# Patient Record
Sex: Male | Born: 1975
Health system: Southern US, Community
[De-identification: ages and names within clinical notes are randomized; demographics above are authoritative.]

## PROBLEM LIST (undated history)

## (undated) DIAGNOSIS — M810 Age-related osteoporosis without current pathological fracture: Secondary | ICD-10-CM

## (undated) DIAGNOSIS — R748 Abnormal levels of other serum enzymes: Secondary | ICD-10-CM

## (undated) DIAGNOSIS — E119 Type 2 diabetes mellitus without complications: Secondary | ICD-10-CM

## (undated) DIAGNOSIS — R160 Hepatomegaly, not elsewhere classified: Secondary | ICD-10-CM

## (undated) DIAGNOSIS — E785 Hyperlipidemia, unspecified: Secondary | ICD-10-CM

## (undated) DIAGNOSIS — R011 Cardiac murmur, unspecified: Secondary | ICD-10-CM

## (undated) DIAGNOSIS — K219 Gastro-esophageal reflux disease without esophagitis: Secondary | ICD-10-CM

## (undated) DIAGNOSIS — K760 Fatty (change of) liver, not elsewhere classified: Secondary | ICD-10-CM

## (undated) HISTORY — DX: Type 2 diabetes mellitus without complications: E11.9

## (undated) HISTORY — PX: IR FIBRIN GLUE REPAIR ANAL FISTULA: IMG2325

## (undated) HISTORY — PX: EAR CANALOPLASTY: SHX1481

## (undated) HISTORY — DX: Hepatomegaly, not elsewhere classified: R16.0

## (undated) HISTORY — PX: NASAL SEPTUM SURGERY: SHX37

## (undated) HISTORY — PX: COLONOSCOPY: SHX174

## (undated) HISTORY — DX: Gastro-esophageal reflux disease without esophagitis: K21.9

## (undated) HISTORY — DX: Abnormal levels of other serum enzymes: R74.8

## (undated) HISTORY — DX: Hyperlipidemia, unspecified: E78.5

## (undated) HISTORY — DX: Fatty (change of) liver, not elsewhere classified: K76.0

---

## 2006-01-18 ENCOUNTER — Ambulatory Visit: Payer: Self-pay | Admitting: Internal Medicine

## 2010-10-22 ENCOUNTER — Ambulatory Visit: Payer: Self-pay | Admitting: Internal Medicine

## 2013-07-26 ENCOUNTER — Ambulatory Visit: Payer: Self-pay | Admitting: Family Medicine

## 2014-12-01 ENCOUNTER — Emergency Department: Payer: Self-pay | Admitting: Emergency Medicine

## 2014-12-04 ENCOUNTER — Ambulatory Visit: Payer: Self-pay | Admitting: Family Medicine

## 2014-12-05 DIAGNOSIS — E119 Type 2 diabetes mellitus without complications: Secondary | ICD-10-CM

## 2014-12-05 HISTORY — DX: Type 2 diabetes mellitus without complications: E11.9

## 2015-04-07 ENCOUNTER — Ambulatory Visit
Admission: RE | Admit: 2015-04-07 | Discharge: 2015-04-07 | Disposition: A | Discharge status: Home / Self Care | Payer: BLUE CROSS/BLUE SHIELD | Attending: Family Medicine | Admitting: Family Medicine

## 2015-04-07 ENCOUNTER — Other Ambulatory Visit: Payer: Self-pay | Admitting: Family Medicine

## 2015-04-07 ENCOUNTER — Ambulatory Visit
Admission: RE | Admit: 2015-04-07 | Discharge: 2015-04-07 | Disposition: A | Discharge status: Home / Self Care | Payer: BLUE CROSS/BLUE SHIELD | Source: Ambulatory Visit | Attending: Family Medicine | Admitting: Family Medicine

## 2015-04-07 DIAGNOSIS — R0789 Other chest pain: Secondary | ICD-10-CM | POA: Insufficient documentation

## 2015-04-17 ENCOUNTER — Other Ambulatory Visit: Payer: Self-pay | Admitting: Family Medicine

## 2015-04-17 DIAGNOSIS — R1011 Right upper quadrant pain: Secondary | ICD-10-CM

## 2015-04-21 ENCOUNTER — Ambulatory Visit
Admission: RE | Admit: 2015-04-21 | Discharge: 2015-04-21 | Disposition: A | Discharge status: Home / Self Care | Payer: BLUE CROSS/BLUE SHIELD | Source: Ambulatory Visit | Attending: Family Medicine | Admitting: Family Medicine

## 2015-04-21 DIAGNOSIS — R1011 Right upper quadrant pain: Secondary | ICD-10-CM | POA: Diagnosis present

## 2015-04-21 DIAGNOSIS — K760 Fatty (change of) liver, not elsewhere classified: Secondary | ICD-10-CM | POA: Insufficient documentation

## 2015-05-29 ENCOUNTER — Telehealth: Payer: Self-pay | Admitting: Family Medicine

## 2015-05-29 NOTE — Telephone Encounter (Signed)
Checking status on his test results. Please return call.

## 2015-06-01 ENCOUNTER — Ambulatory Visit: Payer: Self-pay | Admitting: Family Medicine

## 2015-06-02 ENCOUNTER — Encounter: Payer: Self-pay | Admitting: Family Medicine

## 2015-06-02 ENCOUNTER — Ambulatory Visit (INDEPENDENT_AMBULATORY_CARE_PROVIDER_SITE_OTHER): Payer: BLUE CROSS/BLUE SHIELD | Admitting: Family Medicine

## 2015-06-02 VITALS — BP 100/70 | HR 88 | Temp 98.2°F | Ht 68.0 in | Wt 154.1 lb

## 2015-06-02 DIAGNOSIS — R748 Abnormal levels of other serum enzymes: Secondary | ICD-10-CM | POA: Diagnosis not present

## 2015-06-02 DIAGNOSIS — K219 Gastro-esophageal reflux disease without esophagitis: Secondary | ICD-10-CM

## 2015-06-02 DIAGNOSIS — E785 Hyperlipidemia, unspecified: Secondary | ICD-10-CM | POA: Insufficient documentation

## 2015-06-02 MED ORDER — PANTOPRAZOLE SODIUM 40 MG PO TBEC: 40.0000 mg | DELAYED_RELEASE_TABLET | Freq: Every day | ORAL | Status: AC

## 2015-06-02 NOTE — Telephone Encounter (Signed)
Seen on 06-02-15 appt. And explained.

## 2015-06-02 NOTE — Progress Notes (Signed)
Name: Dalton Cole   MRN: [id number]    DOB: 1976-10-24   Date:06/02/2015       Progress Note  Subjective  Chief Complaint  Chief Complaint  Patient presents with  . Follow-up    fatty liver, gerd, wants to increase protonix to 17m.    Hyperlipidemia This is a chronic problem. Recent lipid tests were reviewed and are high. He has no history of diabetes. Pertinent negatives include no chest pain, leg pain or myalgias. Current antihyperlipidemic treatment includes diet change. There are no compliance problems.   Gastrophageal Reflux He complains of belching and heartburn. He reports no chest pain, no nausea, no sore throat or no wheezing. This is a chronic problem. The problem has been unchanged. The symptoms are aggravated by certain foods (spicy foods). Pertinent negatives include no anemia. He has tried a PPI for the symptoms. The treatment provided moderate relief. Past procedures do not include an EGD.  Pt. Here for recheck of elevated Liver enzymes and GGT. He has history of fatty liver. He is otherwise doing well. He has been referred to gastroenterology but did not undergo the recommended battery of tests. He has significantly decreased his alcohol consumption in the last 6 weeks.   Past Medical History  Diagnosis Date  . GERD (gastroesophageal reflux disease)   . Fatty liver     History reviewed. No pertinent past surgical history.  Family History  Problem Relation Age of Onset  . Heart disease Mother   . Stroke Mother   . Diabetes Father     History   Social History  . Marital Status: Single    Spouse Name: N/A  . Number of Children: N/A  . Years of Education: N/A   Occupational History  . Not on file.   Social History Main Topics  . Smoking status: Never Smoker   . Smokeless tobacco: Never Used  . Alcohol Use: No  . Drug Use: No  . Sexual Activity: Not on file   Other Topics Concern  . Not on file   Social History Narrative  . No narrative  on file     Current outpatient prescriptions:  .  pantoprazole (PROTONIX) 20 MG tablet, Take 1 tablet by mouth daily., Disp: , Rfl: 0  Not on File   Review of Systems  HENT: Negative for sore throat.   Respiratory: Negative for wheezing.   Cardiovascular: Negative for chest pain.  Gastrointestinal: Positive for heartburn. Negative for nausea.  Musculoskeletal: Negative for myalgias.      Objective  Filed Vitals:   06/02/15 1046  BP: 100/70  Pulse: 88  Temp: 98.2 F (36.8 C)  TempSrc: Oral  Height: 5' 8"  (1.727 m)  Weight: 154 lb 1.6 oz (69.899 kg)  SpO2: 95%    Physical Exam  Constitutional: He is well-developed, well-nourished, and in no distress.  HENT:  Head: Normocephalic and atraumatic.  Cardiovascular: Normal rate and regular rhythm.   Pulmonary/Chest: Effort normal and breath sounds normal.  Abdominal: Soft. Bowel sounds are normal.  Nursing note and vitals reviewed.      No results found for this or any previous visit (from the past 2160 hour(s)).   Assessment & Plan 1. Hyperlipidemia We will repeat fasting lipid panel and order A1c to rule out diabetes. He has improved his diet recently. He is not a candidate for statin because of elevated liver enzymes. - HgB A1c - Lipid Profile  2. Elevated liver enzymes  - Comprehensive Metabolic Panel (CMET)  3. Elevated serum GGT level  - Gamma GT  4. Gastroesophageal reflux disease, esophagitis presence not specified Patient has breakthrough symptoms of heartburn on Protonix 20 mg daily. We will increase the dosage to 40 mg daily and follow-up in 3 months. - CBC w/Diff - pantoprazole (PROTONIX) 40 MG tablet; Take 1 tablet (40 mg total) by mouth daily.  Dispense: 90 tablet; Refill: 0    Jolyn Deshmukh Asad A. [facility] 06/02/2015 11:24 AM

## 2015-06-03 LAB — COMPREHENSIVE METABOLIC PANEL
ALK PHOS: 181 IU/L — AB (ref 39–117)
ALT: 96 IU/L — AB (ref 0–44)
AST: 62 IU/L — ABNORMAL HIGH (ref 0–40)
Albumin/Globulin Ratio: 1.4 (ref 1.1–2.5)
Albumin: 4.4 g/dL (ref 3.5–5.5)
BILIRUBIN TOTAL: 0.5 mg/dL (ref 0.0–1.2)
BUN/Creatinine Ratio: 16 (ref 8–19)
BUN: 12 mg/dL (ref 6–20)
CHLORIDE: 99 mmol/L (ref 97–108)
CO2: 25 mmol/L (ref 18–29)
Calcium: 9.9 mg/dL (ref 8.7–10.2)
Creatinine, Ser: 0.74 mg/dL — ABNORMAL LOW (ref 0.76–1.27)
GFR calc non Af Amer: 116 mL/min/{1.73_m2} (ref >59)
GFR, EST AFRICAN AMERICAN: 134 mL/min/{1.73_m2} (ref >59)
GLUCOSE: 147 mg/dL — AB (ref 65–99)
Globulin, Total: 3.2 g/dL (ref 1.5–4.5)
POTASSIUM: 5.1 mmol/L (ref 3.5–5.2)
SODIUM: 140 mmol/L (ref 134–144)
Total Protein: 7.6 g/dL (ref 6.0–8.5)

## 2015-06-03 LAB — CBC WITH DIFFERENTIAL/PLATELET
Basophils Absolute: 0 10*3/uL (ref 0.0–0.2)
Basos: 0 %
EOS (ABSOLUTE): 0.1 10*3/uL (ref 0.0–0.4)
EOS: 2 %
Hematocrit: 41.9 % (ref 37.5–51.0)
Hemoglobin: 13.9 g/dL (ref 12.6–17.7)
IMMATURE GRANS (ABS): 0 10*3/uL (ref 0.0–0.1)
IMMATURE GRANULOCYTES: 0 %
Lymphocytes Absolute: 2.2 10*3/uL (ref 0.7–3.1)
Lymphs: 28 %
MCH: 25.4 pg — ABNORMAL LOW (ref 26.6–33.0)
MCHC: 33.2 g/dL (ref 31.5–35.7)
MCV: 77 fL — AB (ref 79–97)
Monocytes Absolute: 0.6 10*3/uL (ref 0.1–0.9)
Monocytes: 7 %
NEUTROS PCT: 63 %
Neutrophils Absolute: 5 10*3/uL (ref 1.4–7.0)
PLATELETS: 392 10*3/uL — AB (ref 150–379)
RBC: 5.48 x10E6/uL (ref 4.14–5.80)
RDW: 14.5 % (ref 12.3–15.4)
WBC: 7.9 10*3/uL (ref 3.4–10.8)

## 2015-06-03 LAB — LIPID PANEL
Chol/HDL Ratio: 6.7 ratio units — ABNORMAL HIGH (ref 0.0–5.0)
Cholesterol, Total: 300 mg/dL — ABNORMAL HIGH (ref 100–199)
HDL: 45 mg/dL (ref >39)
LDL CALC: 217 mg/dL — AB (ref 0–99)
TRIGLYCERIDES: 192 mg/dL — AB (ref 0–149)
VLDL Cholesterol Cal: 38 mg/dL (ref 5–40)

## 2015-06-03 LAB — HEMOGLOBIN A1C
Est. average glucose Bld gHb Est-mCnc: 229 mg/dL
Hgb A1c MFr Bld: 9.6 % — ABNORMAL HIGH (ref 4.8–5.6)

## 2015-06-03 LAB — GAMMA GT: GGT: 70 IU/L — ABNORMAL HIGH (ref 0–65)

## 2015-06-05 ENCOUNTER — Encounter: Payer: Self-pay | Admitting: Urgent Care

## 2015-06-05 ENCOUNTER — Ambulatory Visit (INDEPENDENT_AMBULATORY_CARE_PROVIDER_SITE_OTHER): Payer: BLUE CROSS/BLUE SHIELD | Admitting: Urgent Care

## 2015-06-05 VITALS — BP 125/81 | HR 96 | Temp 98.4°F | Ht 68.0 in | Wt 154.0 lb

## 2015-06-05 DIAGNOSIS — R7989 Other specified abnormal findings of blood chemistry: Secondary | ICD-10-CM

## 2015-06-05 DIAGNOSIS — K760 Fatty (change of) liver, not elsewhere classified: Secondary | ICD-10-CM

## 2015-06-05 DIAGNOSIS — R945 Abnormal results of liver function studies: Principal | ICD-10-CM

## 2015-06-05 NOTE — Assessment & Plan Note (Signed)
He will have labs drawn Avoid tylenol & ETOH Further recommendations pending labs

## 2015-06-05 NOTE — Progress Notes (Signed)
   Primary Care Physician: Geri Seminole, MD Primary Gastroenterologist:  Dr Allen Norris  Chief Complaint  Patient presents with  . Elevated Hepatic Enzymes    HPI: Dalton Cole is a 39 y.o. male here for follow up of elevated LFTS.  ALP 181, AST 62, & ALT 96.  He never went ot have serologic hepatic labs I ordered in May.  He quit ETOH 2 weeks ago.  Denies heartburn, indigestion, nausea, vomiting, dysphagia, odynophagia or anorexia. Denies constipation, diarrhea, rectal bleeding, melena or weight loss. No excessive tylenol.   Current Outpatient Prescriptions  Medication Sig Dispense Refill  . pantoprazole (PROTONIX) 40 MG tablet Take 1 tablet (40 mg total) by mouth daily. 90 tablet 0   No current facility-administered medications for this visit.    Allergies as of 06/05/2015  . (No Known Allergies)    Review of Systems: Gen: Denies any fever, chills, fatigue, weakness, malaise ENT: Negative for hoarseness, difficulty swallowing , nasal congestion CV: Denies chest pain, angina, palpitations, syncope, orthopnea, PND, peripheral edema, and claudication. Resp: Denies dyspnea at rest, dyspnea with exercise, cough, sputum, wheezing, coughing up blood, and pleurisy. GI: See HPI GU:  Negative for dysuria, hematuria, urinary incontinence, urinary frequency, nocturnal urination.  Endo: Negative for unusual weight change or sweats Derm: Denies jaundice, rash, itching, or unhealing ulcers.  Psych: Denies depression, anxiety, memory loss, suicidal ideation, hallucinations, paranoia, and confusion. Heme: Denies bruising, bleeding, and enlarged lymph nodes.   Physical Examination:  BP 125/81 mmHg  Pulse 96  Temp(Src) 98.4 F (36.9 C) (Oral)  Ht 5' 8"  (1.727 m)  Wt 154 lb (69.854 kg)  BMI 23.42 kg/m2 Body mass index is 23.42 kg/(m^2). No LMP for male patient. General:   Alert,  Well-developed, well-nourished, pleasant and cooperative in NAD Head:  Normocephalic and  atraumatic. Eyes:  Sclera clear, no icterus.   Conjunctiva pink. Mouth:  No deformity or lesions.  Oropharynx pink & moist. Neck:  Supple; no masses or thyromegaly. Heart:  Regular rate and rhythm; no murmurs, clicks, rubs,  or gallops. Abdomen:   Normal bowel sounds.  Soft, nontender and nondistended. No masses, hepatosplenomegaly or hernias noted. No guarding or rebound tenderness.   Msk:  Symmetrical without gross deformities. Normal posture. Pulses:  Normal pulses noted. Extremities:  Without clubbing or edema. Neurologic:  Alert and  oriented x3;  grossly normal neurologically. Skin:  Intact without significant lesions or rashes. Cervical Nodes:  No significant cervical adenopathy. Psych:  Alert and cooperative. Normal mood and affect.

## 2015-06-05 NOTE — Patient Instructions (Signed)
Please get your labs as soon as possible.  We will call you with results. Avoid tylenol or acetaminophen Avoid alcohol  Fatty Liver Fatty liver is the accumulation of fat in liver cells. It is also called hepatosteatosis or steatohepatitis. It is normal for your liver to contain some fat. If fat is more than 5 to 10% of your liver's weight, you have fatty liver.  There are often no symptoms (problems) for years while damage is still occurring. People often learn about their fatty liver when they have medical tests for other reasons. Fat can damage your liver for years or even decades without causing problems. When it becomes severe, it can cause fatigue, weight loss, weakness, and confusion. This makes you more likely to develop more serious liver problems. The liver is the largest organ in the body. It does a lot of work and often gives no warning signs when it is sick until late in a disease. The liver has many important jobs including:  Breaking down foods.  Storing vitamins, iron, and other minerals.  Making proteins.  Making bile for food digestion.  Breaking down many products including medications, alcohol and some poisons. CAUSES  There are a number of different conditions, medications, and poisons that can cause a fatty liver. Eating too many calories causes fat to build up in the liver. Not processing and breaking fats down normally may also cause this. Certain conditions, such as obesity, diabetes, and high triglycerides also cause this. Most fatty liver patients tend to be middle-aged and over weight.  Some causes of fatty liver are:  Alcohol over consumption.  Malnutrition.  Steroid use.  Valproic acid toxicity.  Obesity.  Cushing's syndrome.  Poisons.  Tetracycline in high dosages.  Pregnancy.  Diabetes.  Hyperlipidemia.  Rapid weight loss. Some people develop fatty liver even having none of these conditions. SYMPTOMS  Fatty liver most often causes no  problems. This is called asymptomatic.  It can be diagnosed with blood tests and also by a liver biopsy.  It is one of the most common causes of minor elevations of liver enzymes on routine blood tests.  Specialized Imaging of the liver using ultrasound, CT (computed tomography) scan, or MRI (magnetic resonance imaging) can suggest a fatty liver but a biopsy is needed to confirm it.  A biopsy involves taking a small sample of liver tissue. This is done by using a needle. It is then looked at under a microscope by a specialist. TREATMENT  It is important to treat the cause. Simple fatty liver without a medical reason may not need treatment.  Weight loss, fat restriction, and exercise in overweight patients produces inconsistent results but is worth trying.  Fatty liver due to alcohol toxicity may not improve even with stopping drinking.  Good control of diabetes may reduce fatty liver.  Lower your triglycerides through diet, medication or both.  Eat a balanced, healthy diet.  Increase your physical activity.  Get regular checkups from a liver specialist.  There are no medical or surgical treatments for a fatty liver or NASH, but improving your diet and increasing your exercise may help prevent or reverse some of the damage. PROGNOSIS  Fatty liver may cause no damage or it can lead to an inflammation of the liver. This is, called steatohepatitis. When it is linked to alcohol abuse, it is called alcoholic steatohepatitis. It often is not linked to alcohol. It is then called nonalcoholic steatohepatitis, or NASH. Over time the liver may become scarred and  hardened. This condition is called cirrhosis. Cirrhosis is serious and may lead to liver failure or cancer. NASH is one of the leading causes of cirrhosis. About 10-20% of Americans have fatty liver and a smaller 2-5% has NASH. Document Released: 01/06/2006 Document Revised: 02/13/2012 Document Reviewed: 04/02/2014 [facility] Patient  Information 2015 [place], [place]. This information is not intended to replace advice given to you by your health care provider. Make sure you discuss any questions you have with your health care provider.

## 2015-06-10 LAB — MITOCHONDRIAL/SMOOTH MUSCLE AB PNL
MITOCHONDRIAL AB: 8.9 U (ref 0.0–20.0)
SMOOTH MUSCLE AB: 8 U (ref 0–19)

## 2015-06-10 LAB — ALPHA-1-ANTITRYPSIN: A-1 Antitrypsin: 139 mg/dL (ref 90–200)

## 2015-06-10 LAB — IGG, IGA, IGM
IGG (IMMUNOGLOBIN G), SERUM: 1284 mg/dL (ref 700–1600)
IGM (IMMUNOGLOBULIN M), SRM: 100 mg/dL (ref 20–172)
IgA/Immunoglobulin A, Serum: 366 mg/dL (ref 90–386)

## 2015-06-10 LAB — HEPATITIS B SURFACE ANTIBODY,QUALITATIVE: HEP B SURFACE AB, QUAL: NONREACTIVE

## 2015-06-10 LAB — CERULOPLASMIN: CERULOPLASMIN: 33.6 mg/dL — AB (ref 16.0–31.0)

## 2015-06-10 LAB — IRON AND TIBC
IRON SATURATION: 17 % (ref 15–55)
Iron: 83 ug/dL (ref 38–169)
TIBC: 489 ug/dL — AB (ref 250–450)
UIBC: 406 ug/dL — ABNORMAL HIGH (ref 111–343)

## 2015-06-10 LAB — HIV ANTIBODY (ROUTINE TESTING W REFLEX): HIV Screen 4th Generation wRfx: NONREACTIVE

## 2015-06-10 LAB — PROTIME-INR
INR: 1 (ref 0.8–1.2)
Prothrombin Time: 10.4 s (ref 9.1–12.0)

## 2015-06-10 LAB — FERRITIN: Ferritin: 26 ng/mL — ABNORMAL LOW (ref 30–400)

## 2015-06-10 LAB — HEPATITIS C ANTIBODY

## 2015-06-10 LAB — ANA: Anti Nuclear Antibody(ANA): NEGATIVE

## 2015-06-10 LAB — HEPATITIS A ANTIBODY, TOTAL: Hep A Total Ab: POSITIVE — AB

## 2015-06-10 LAB — TSH: TSH: 1.52 u[IU]/mL (ref 0.450–4.500)

## 2015-06-10 LAB — HEPATITIS B SURFACE ANTIGEN: Hepatitis B Surface Ag: NEGATIVE

## 2015-06-11 ENCOUNTER — Telehealth: Payer: Self-pay | Admitting: Urgent Care

## 2015-06-11 ENCOUNTER — Encounter: Payer: Self-pay | Admitting: Urgent Care

## 2015-06-11 NOTE — Telephone Encounter (Signed)
Discussed all lab results with patient.   Hepatic labs look ok.  He has mild microcytic iron deficient anemia.  Discussed colonoscopy & EGD with Dr Allen Norris for [place].  I have discussed risks & benefits which include, but are not limited to, bleeding, infection, perforation & drug reaction.  The patient agrees with this plan & written consent will be obtained.     Ginger, Please set up colon & EGD for IDA with Dr Allen Norris in [place].  He will also need FU for elevated LFTS with Dr Allen Norris after procedure.  Thanks

## 2015-06-12 ENCOUNTER — Ambulatory Visit (INDEPENDENT_AMBULATORY_CARE_PROVIDER_SITE_OTHER): Payer: BLUE CROSS/BLUE SHIELD | Admitting: Family Medicine

## 2015-06-12 ENCOUNTER — Encounter: Payer: Self-pay | Admitting: Family Medicine

## 2015-06-12 VITALS — BP 118/82 | HR 77 | Wt 155.0 lb

## 2015-06-12 DIAGNOSIS — IMO0002 Reserved for concepts with insufficient information to code with codable children: Secondary | ICD-10-CM | POA: Insufficient documentation

## 2015-06-12 DIAGNOSIS — E1165 Type 2 diabetes mellitus with hyperglycemia: Secondary | ICD-10-CM

## 2015-06-12 DIAGNOSIS — E785 Hyperlipidemia, unspecified: Secondary | ICD-10-CM

## 2015-06-12 MED ORDER — METFORMIN HCL 500 MG PO TABS: 500.0000 mg | ORAL_TABLET | Freq: Two times a day (BID) | ORAL | Status: DC

## 2015-06-12 MED ORDER — LISINOPRIL 2.5 MG PO TABS: 2.5000 mg | ORAL_TABLET | Freq: Every day | ORAL | Status: AC

## 2015-06-12 NOTE — Progress Notes (Signed)
Name: Dalton Cole   MRN: [id number]    DOB: 18-Jan-1976   Date:06/12/2015       Progress Note  Subjective  Chief Complaint  Chief Complaint  Patient presents with  . Results    lab & A1C  . Medication Management    Diabetes He presents for his initial diabetic visit. He has type 2 diabetes mellitus. His disease course has been stable. Pertinent negatives for diabetes include no chest pain, no fatigue, no polydipsia and no polyuria. Current diabetic treatment includes diet. His weight is stable. He is following a diabetic and generally healthy diet. His breakfast blood glucose is taken between 7-8 am. His breakfast blood glucose range is generally 110-130 mg/dl.      Past Medical History  Diagnosis Date  . GERD (gastroesophageal reflux disease)   . Fatty liver   . Elevated liver enzymes   . Hyperlipemia   . Hepatomegaly     No past surgical history on file.  Family History  Problem Relation Age of Onset  . Heart disease Mother   . Stroke Mother   . Diabetes Father   . Alcohol abuse Brother   . Colon cancer Neg Hx   . Liver disease Neg Hx     History   Social History  . Marital Status: Single    Spouse Name: N/A  . Number of Children: 0  . 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 Smoking status: Never Smoker   . Smokeless tobacco: Never Used  . Alcohol Use: 0.0 oz/week    0 Standard drinks or equivalent per week     Comment: hx several drinks per week but quit 05/2015  . Drug Use: No  . Sexual Activity: Not on file   Other Topics Concern  . Not on file   Social History Narrative   [employer], married, no children     Current outpatient prescriptions:  .  pantoprazole (PROTONIX) 40 MG tablet, Take 1 tablet (40 mg total) by mouth daily., Disp: 90 tablet, Rfl: 0  No Known Allergies   Review of Systems  Constitutional: Negative for fatigue.  Cardiovascular: Negative for chest pain.    Endo/Heme/Allergies: Negative for polydipsia.      Objective  Filed Vitals:   06/12/15 0945  BP: 118/82  Pulse: 77  Weight: 155 lb (70.308 kg)  SpO2: 95%    Physical Exam  Constitutional: He is well-developed, well-nourished, and in no distress.  Cardiovascular: Normal rate and regular rhythm.   Pulmonary/Chest: Effort normal and breath sounds normal.  Musculoskeletal:       Cervical back: He exhibits pain.       Back:  Nursing note and vitals reviewed.   Assessment & Plan 1. Diabetes mellitus type 2, uncontrolled Patient will be started on metformin 500 mg twice a day for newly diagnosed type 2 diabetes mellitus. In addition, we will start on low-dose ACE-I for renal protection. Patient will return in one month for follow-up. - metFORMIN (GLUCOPHAGE) 500 MG tablet; Take 1 tablet (500 mg total) by mouth 2 (two) times daily with a meal.  Dispense: 180 tablet; Refill: 0 - lisinopril (ZESTRIL) 2.5 MG tablet; Take 1 tablet (2.5 mg total) by mouth daily.  Dispense: 90 tablet; Refill: 1  2. Hyperlipidemia We will contact Dr. Dorothey Baseman office to determine if it is okay to start patient on statin therapy in view of his elevated liver  enzymes   Dilana Mcphie Asad A. [facility] 06/12/2015 10:27 AM

## 2015-06-15 ENCOUNTER — Ambulatory Visit: Payer: Self-pay | Admitting: Family Medicine

## 2015-06-18 NOTE — Telephone Encounter (Signed)
LVM for pt to return my call to schedule colon/EGD.

## 2015-07-09 NOTE — Telephone Encounter (Signed)
Mailed letter requesting pt to call and schedule procedures.

## 2015-07-24 ENCOUNTER — Ambulatory Visit: Payer: BLUE CROSS/BLUE SHIELD | Admitting: Family Medicine

## 2015-08-04 ENCOUNTER — Telehealth: Payer: Self-pay | Admitting: Gastroenterology

## 2015-08-04 NOTE — Telephone Encounter (Signed)
Pt. Was wondering why Dr. Allen Norris wanted to do a colonoscopy? Please call

## 2015-09-03 ENCOUNTER — Ambulatory Visit: Payer: BLUE CROSS/BLUE SHIELD | Admitting: Family Medicine

## 2015-09-11 NOTE — Telephone Encounter (Signed)
Made ginger aware of this message being sent

## 2016-02-24 ENCOUNTER — Other Ambulatory Visit: Payer: Self-pay | Admitting: Family Medicine

## 2016-03-24 DIAGNOSIS — E119 Type 2 diabetes mellitus without complications: Secondary | ICD-10-CM | POA: Insufficient documentation

## 2016-04-27 DIAGNOSIS — N478 Other disorders of prepuce: Secondary | ICD-10-CM | POA: Insufficient documentation

## 2016-09-12 DIAGNOSIS — G4733 Obstructive sleep apnea (adult) (pediatric): Secondary | ICD-10-CM | POA: Insufficient documentation

## 2016-09-14 ENCOUNTER — Other Ambulatory Visit: Payer: Self-pay | Admitting: Specialist

## 2016-09-14 DIAGNOSIS — R911 Solitary pulmonary nodule: Secondary | ICD-10-CM

## 2016-09-16 ENCOUNTER — Ambulatory Visit
Admission: RE | Admit: 2016-09-16 | Discharge: 2016-09-16 | Disposition: A | Discharge status: Home / Self Care | Payer: BLUE CROSS/BLUE SHIELD | Source: Ambulatory Visit | Attending: Specialist | Admitting: Specialist

## 2016-09-16 DIAGNOSIS — R911 Solitary pulmonary nodule: Secondary | ICD-10-CM | POA: Diagnosis not present

## 2016-09-20 ENCOUNTER — Ambulatory Visit: Payer: BLUE CROSS/BLUE SHIELD

## 2016-10-04 ENCOUNTER — Ambulatory Visit: Payer: BLUE CROSS/BLUE SHIELD | Attending: Specialist

## 2016-10-04 DIAGNOSIS — G4733 Obstructive sleep apnea (adult) (pediatric): Secondary | ICD-10-CM | POA: Insufficient documentation

## 2016-12-26 DIAGNOSIS — K219 Gastro-esophageal reflux disease without esophagitis: Secondary | ICD-10-CM | POA: Diagnosis not present

## 2016-12-26 DIAGNOSIS — R7989 Other specified abnormal findings of blood chemistry: Secondary | ICD-10-CM | POA: Diagnosis not present

## 2016-12-26 DIAGNOSIS — E7800 Pure hypercholesterolemia, unspecified: Secondary | ICD-10-CM | POA: Diagnosis not present

## 2016-12-26 DIAGNOSIS — E119 Type 2 diabetes mellitus without complications: Secondary | ICD-10-CM | POA: Diagnosis not present

## 2017-01-02 DIAGNOSIS — E119 Type 2 diabetes mellitus without complications: Secondary | ICD-10-CM | POA: Diagnosis not present

## 2017-01-02 DIAGNOSIS — K219 Gastro-esophageal reflux disease without esophagitis: Secondary | ICD-10-CM | POA: Diagnosis not present

## 2017-01-02 DIAGNOSIS — R7989 Other specified abnormal findings of blood chemistry: Secondary | ICD-10-CM | POA: Diagnosis not present

## 2017-01-02 DIAGNOSIS — E538 Deficiency of other specified B group vitamins: Secondary | ICD-10-CM | POA: Diagnosis not present

## 2017-01-02 DIAGNOSIS — E785 Hyperlipidemia, unspecified: Secondary | ICD-10-CM | POA: Diagnosis not present

## 2017-01-12 DIAGNOSIS — J09X2 Influenza due to identified novel influenza A virus with other respiratory manifestations: Secondary | ICD-10-CM | POA: Diagnosis not present

## 2017-01-18 ENCOUNTER — Emergency Department: Payer: 59

## 2017-01-18 ENCOUNTER — Observation Stay
Admission: EM | Admit: 2017-01-18 | Discharge: 2017-01-20 | Disposition: A | Discharge status: Home / Self Care | Payer: 59 | Attending: Internal Medicine | Admitting: Internal Medicine

## 2017-01-18 ENCOUNTER — Encounter: Payer: Self-pay | Admitting: *Deleted

## 2017-01-18 DIAGNOSIS — K219 Gastro-esophageal reflux disease without esophagitis: Secondary | ICD-10-CM | POA: Diagnosis not present

## 2017-01-18 DIAGNOSIS — R000 Tachycardia, unspecified: Secondary | ICD-10-CM | POA: Diagnosis not present

## 2017-01-18 DIAGNOSIS — J209 Acute bronchitis, unspecified: Secondary | ICD-10-CM | POA: Diagnosis not present

## 2017-01-18 DIAGNOSIS — R6510 Systemic inflammatory response syndrome (SIRS) of non-infectious origin without acute organ dysfunction: Secondary | ICD-10-CM | POA: Diagnosis not present

## 2017-01-18 DIAGNOSIS — H7011 Chronic mastoiditis, right ear: Secondary | ICD-10-CM | POA: Insufficient documentation

## 2017-01-18 DIAGNOSIS — E860 Dehydration: Secondary | ICD-10-CM | POA: Insufficient documentation

## 2017-01-18 DIAGNOSIS — I10 Essential (primary) hypertension: Secondary | ICD-10-CM | POA: Insufficient documentation

## 2017-01-18 DIAGNOSIS — K7581 Nonalcoholic steatohepatitis (NASH): Secondary | ICD-10-CM | POA: Diagnosis not present

## 2017-01-18 DIAGNOSIS — R059 Cough, unspecified: Secondary | ICD-10-CM

## 2017-01-18 DIAGNOSIS — E119 Type 2 diabetes mellitus without complications: Secondary | ICD-10-CM | POA: Insufficient documentation

## 2017-01-18 DIAGNOSIS — R05 Cough: Secondary | ICD-10-CM | POA: Diagnosis not present

## 2017-01-18 DIAGNOSIS — R160 Hepatomegaly, not elsewhere classified: Secondary | ICD-10-CM | POA: Diagnosis not present

## 2017-01-18 DIAGNOSIS — Z7982 Long term (current) use of aspirin: Secondary | ICD-10-CM | POA: Diagnosis not present

## 2017-01-18 DIAGNOSIS — E876 Hypokalemia: Secondary | ICD-10-CM | POA: Diagnosis not present

## 2017-01-18 DIAGNOSIS — E785 Hyperlipidemia, unspecified: Secondary | ICD-10-CM | POA: Insufficient documentation

## 2017-01-18 LAB — BASIC METABOLIC PANEL
Anion gap: 15 (ref 5–15)
BUN: 19 mg/dL (ref 6–20)
CALCIUM: 9.4 mg/dL (ref 8.9–10.3)
CO2: 20 mmol/L — AB (ref 22–32)
CREATININE: 0.91 mg/dL (ref 0.61–1.24)
Chloride: 104 mmol/L (ref 101–111)
GFR calc non Af Amer: >60 mL/min (ref >60)
GLUCOSE: 134 mg/dL — AB (ref 65–99)
Potassium: 3 mmol/L — ABNORMAL LOW (ref 3.5–5.1)
Sodium: 139 mmol/L (ref 135–145)

## 2017-01-18 LAB — CBC WITH DIFFERENTIAL/PLATELET
BASOS PCT: 1 %
Basophils Absolute: 0.1 10*3/uL (ref 0–0.1)
EOS ABS: 0.2 10*3/uL (ref 0–0.7)
EOS PCT: 2 %
HCT: 42.8 % (ref 40.0–52.0)
Hemoglobin: 13.8 g/dL (ref 13.0–18.0)
Lymphocytes Relative: 49 %
Lymphs Abs: 5.5 10*3/uL — ABNORMAL HIGH (ref 1.0–3.6)
MCH: 24.1 pg — ABNORMAL LOW (ref 26.0–34.0)
MCHC: 32.3 g/dL (ref 32.0–36.0)
MCV: 74.7 fL — ABNORMAL LOW (ref 80.0–100.0)
MONO ABS: 0.6 10*3/uL (ref 0.2–1.0)
MONOS PCT: 6 %
Neutro Abs: 4.6 10*3/uL (ref 1.4–6.5)
Neutrophils Relative %: 42 %
PLATELETS: 370 10*3/uL (ref 150–440)
RBC: 5.73 MIL/uL (ref 4.40–5.90)
RDW: 14.8 % — AB (ref 11.5–14.5)
WBC: 11 10*3/uL — ABNORMAL HIGH (ref 3.8–10.6)

## 2017-01-18 LAB — TROPONIN I: Troponin I: <0.03 ng/mL (ref <0.03)

## 2017-01-18 MED ORDER — HYDROCOD POLST-CPM POLST ER 10-8 MG/5ML PO SUER
5.0000 mL | Freq: Once | ORAL | Status: AC
  Administered 2017-01-18: 5 mL via ORAL

## 2017-01-18 MED ORDER — METHYLPREDNISOLONE SODIUM SUCC 125 MG IJ SOLR
INTRAMUSCULAR | Status: AC
  Administered 2017-01-18: 125 mg via INTRAVENOUS
  Filled 2017-01-18: qty 2

## 2017-01-18 MED ORDER — FAMOTIDINE IN NACL 20-0.9 MG/50ML-% IV SOLN
20.0000 mg | Freq: Once | INTRAVENOUS | Status: AC
  Administered 2017-01-18: 20 mg via INTRAVENOUS

## 2017-01-18 MED ORDER — IPRATROPIUM-ALBUTEROL 0.5-2.5 (3) MG/3ML IN SOLN
3.0000 mL | Freq: Once | RESPIRATORY_TRACT | Status: AC
  Administered 2017-01-18: 3 mL via RESPIRATORY_TRACT

## 2017-01-18 MED ORDER — DIPHENHYDRAMINE HCL 50 MG/ML IJ SOLN
INTRAMUSCULAR | Status: AC
  Filled 2017-01-18: qty 1

## 2017-01-18 MED ORDER — HYDROCOD POLST-CPM POLST ER 10-8 MG/5ML PO SUER
ORAL | Status: AC
  Filled 2017-01-18: qty 5

## 2017-01-18 MED ORDER — IPRATROPIUM-ALBUTEROL 0.5-2.5 (3) MG/3ML IN SOLN
RESPIRATORY_TRACT | Status: AC
  Filled 2017-01-18: qty 6

## 2017-01-18 MED ORDER — FAMOTIDINE IN NACL 20-0.9 MG/50ML-% IV SOLN
INTRAVENOUS | Status: AC
  Filled 2017-01-18: qty 50

## 2017-01-18 MED ORDER — DIPHENHYDRAMINE HCL 50 MG/ML IJ SOLN
50.0000 mg | Freq: Once | INTRAMUSCULAR | Status: AC
  Administered 2017-01-18: 50 mg via INTRAVENOUS

## 2017-01-18 MED ORDER — METHYLPREDNISOLONE SODIUM SUCC 125 MG IJ SOLR
125.0000 mg | Freq: Once | INTRAMUSCULAR | Status: AC
  Administered 2017-01-18: 125 mg via INTRAVENOUS

## 2017-01-18 NOTE — ED Triage Notes (Signed)
Pt dx with flu last week    Pt finished tamiflu yesterday.  Pt continues to have a cough.  Pt alert.  Non smoker.

## 2017-01-18 NOTE — ED Notes (Signed)
PCXR complete.

## 2017-01-18 NOTE — ED Notes (Signed)
Lab called saying LA was hemolyzed, redrawing.

## 2017-01-18 NOTE — ED Notes (Signed)
Pt diaphoretic, dry hacking constant cough, c/o SHOB, throat swelling & pain; lungs sounds diminished; charge nurse called & pt taken immed to room 8 via w/c; placed in hosp gown & on card monitor; MD notified and duonebs admin

## 2017-01-18 NOTE — ED Notes (Signed)
RT notified that patient needs humidification added to his oxygen.

## 2017-01-18 NOTE — ED Provider Notes (Signed)
[facility] Emergency Department Provider Note   First MD Initiated Contact with Patient 01/18/17 2304     (approximate)  I have reviewed the triage vital signs and the nursing notes.   HISTORY  Chief Complaint Cough and Influenza   HPI Dalton Cole is a 41 y.o. male with below list of chronic medical conditions presents with acute onset of "throat itching followed by dyspnea while eating dinner tonight. Patient states that he completed Tamiflu and his symptoms were improving however his cough has been worse at night   Past Medical History:  Diagnosis Date  . Elevated liver enzymes   . Fatty liver   . GERD (gastroesophageal reflux disease)   . Hepatomegaly   . Hyperlipemia     Patient Active Problem List   Diagnosis Date Noted  . Diabetes mellitus type 2, uncontrolled ([place]) 06/12/2015  . Elevated liver function tests 06/05/2015  . Hepatic steatosis 06/05/2015  . Hyperlipidemia 06/02/2015  . Elevated liver enzymes 06/02/2015  . Elevated serum GGT level 06/02/2015  . GERD (gastroesophageal reflux disease) 06/02/2015    No past surgical history on file.  Prior to Admission medications   Medication Sig Start Date End Date Taking? Authorizing Provider  lisinopril (ZESTRIL) 2.5 MG tablet Take 1 tablet (2.5 mg total) by mouth daily. 06/12/15   Roselee Nova, MD  metFORMIN (GLUCOPHAGE) 500 MG tablet Take 1 tablet (500 mg total) by mouth 2 (two) times daily with a meal. 06/12/15   Roselee Nova, MD  pantoprazole (PROTONIX) 40 MG tablet Take 1 tablet (40 mg total) by mouth daily. 06/02/15   Roselee Nova, MD    Allergies Patient has no known allergies.  Family History  Problem Relation Age of Onset  . Heart disease Mother   . Stroke Mother   . Diabetes Father   . Alcohol abuse Brother   . Colon cancer Neg Hx   . Liver disease Neg Hx     Social History Social History  Substance Use Topics  . Smoking status: Never Smoker    . Smokeless tobacco: Never Used  . Alcohol use 0.0 oz/week     Comment: hx several drinks per week but quit 05/2015    Review of Systems Constitutional: No fever/chills Eyes: No visual changes. ENT: No sore throat. Cardiovascular: Denies chest pain. Respiratory:Positive dyspnea and cough Gastrointestinal: No abdominal pain.  No nausea, no vomiting.  No diarrhea.  No constipation. Genitourinary: Negative for dysuria. Musculoskeletal: Negative for back pain. Skin: Negative for rash. Neurological: Negative for headaches, focal weakness or numbness.  10-point ROS otherwise negative.  ____________________________________________   PHYSICAL EXAM:  VITAL SIGNS: ED Triage Vitals  Enc Vitals Group     BP 01/18/17 2223 130/89     Pulse Rate 01/18/17 2223 (!) 128     Resp 01/18/17 2223 20     Temp 01/18/17 2223 98 F (36.7 C)     Temp Source 01/18/17 2223 Oral     SpO2 01/18/17 2223 100 %     Weight 01/18/17 2226 155 lb (70.3 kg)     Height 01/18/17 2226 5' 8"  (1.727 m)     Head Circumference --      Peak Flow --      Pain Score --      Pain Loc --      Pain Edu? --      Excl. in [place]? --     Constitutional: Alert and oriented.  Apparent dyspnea Eyes: Conjunctivae are normal. PERRL. EOMI. Head: Atraumatic. Nose: No congestion/rhinnorhea. Mouth/Throat: Mucous membranes are moist. Neck: No stridor.   Cardiovascular: Normal rate, regular rhythm. Good peripheral circulation. Grossly normal heart sounds. Respiratory: Normal respiratory effort.  No retractions. Lungs CTAB. Gastrointestinal: Soft and nontender. No distention.  Musculoskeletal: No lower extremity tenderness nor edema. No gross deformities of extremities. Neurologic:  Normal speech and language. No gross focal neurologic deficits are appreciated.  Skin:  Skin is warm, dry and intact. No rash noted. Psychiatric: Mood and affect are normal. Speech and behavior are  normal.  ____________________________________________   LABS (all labs ordered are listed, but only abnormal results are displayed)  Labs Reviewed  CULTURE, BLOOD (ROUTINE X 2)  CULTURE, BLOOD (ROUTINE X 2)  TROPONIN I  LACTIC ACID, PLASMA  LACTIC ACID, PLASMA  BASIC METABOLIC PANEL  CBC WITH DIFFERENTIAL/PLATELET   ____________________________________________  EKG  ED ECG REPORT I, Gordon N BROWN, the attending physician, personally viewed and interpreted this ECG.   Date: 01/18/2017  EKG Time: 10:51  Rate: 121  Rhythm: Sinus tachycardia  Axis: Normal   Intervals:Normal  ST&T Change: None  ____________________________________________  RADIOLOGY I, Ventura Ernst Bowler, personally viewed and evaluated these images (plain radiographs) as part of my medical decision making, as well as reviewing the written report by the radiologist.  Dg Chest Port 1 View  Result Date: 01/18/2017 CLINICAL DATA:  Cough, diagnosed with flu last week EXAM: PORTABLE CHEST 1 VIEW COMPARISON:  Chest CT 09/16/2016, CXR 04/07/2015 FINDINGS: The heart size and mediastinal contours are within normal limits for an AP projection. Both lungs are clear. The visualized skeletal structures are unremarkable. IMPRESSION: No active disease. Electronically Signed   By: Ashley Royalty M.D.   On: 01/18/2017 22:55     Procedures  Critical care: CRITICAL CARE Performed by: Gregor Hams   Total critical care time: 30 minutes  Critical care time was exclusive of separately billable procedures and treating other patients.  Critical care was necessary to treat or prevent imminent or life-threatening deterioration.  Critical care was time spent personally by me on the following activities: development of treatment plan with patient and/or surrogate as well as nursing, discussions with consultants, evaluation of patient's response to treatment, examination of patient, obtaining history from patient or surrogate,  ordering and performing treatments and interventions, ordering and review of laboratory studies, ordering and review of radiographic studies, pulse oximetry and re-evaluation of patient's condition.  _____________   INITIAL IMPRESSION / ASSESSMENT AND PLAN / ED COURSE  Pertinent labs & imaging results that were available during my care of the patient were reviewed by me and considered in my medical decision making (see chart for details).  41 year old male presenting to the emergency department with respiratory distress as such DuoNeb's administered. Patient admits to "itching in his throat while eating dinner and a such possibility of allergic reaction was considered patient received Solu-Medrol and Benadryl. Patient noted to be clinically markedly dehydrated following receiving 2 L IV normal saline patient had no urine production however additional liter was given and patient did void. Patient noted to have a lactic acid is 7.1 which decreased to 2.0 status post IV hydration. Patient d-dimer was markedly elevated and a such CT scan of the chest was performed which revealed no evidence of pulmonary emboli. Patient discussed with Dr.Pyreddy for hospitalist for further evaluation and management      ____________________________________________  FINAL CLINICAL IMPRESSION(S) / ED DIAGNOSES  Respiratory distress Dehydration  MEDICATIONS GIVEN DURING THIS VISIT:  Medications  methylPREDNISolone sodium succinate (SOLU-MEDROL) 125 mg/2 mL injection (not administered)  diphenhydrAMINE (BENADRYL) 50 MG/ML injection (not administered)  famotidine (PEPCID) 20-0.9 MG/50ML-% IVPB (not administered)  chlorpheniramine-HYDROcodone (TUSSIONEX) 10-8 MG/5ML suspension (not administered)  ipratropium-albuterol (DUONEB) 0.5-2.5 (3) MG/3ML nebulizer solution 3 mL (3 mLs Nebulization Given 01/18/17 2240)  ipratropium-albuterol (DUONEB) 0.5-2.5 (3) MG/3ML nebulizer solution 3 mL (3 mLs Nebulization Given  01/18/17 2240)     NEW OUTPATIENT MEDICATIONS STARTED DURING THIS VISIT:  New Prescriptions   No medications on file    Modified Medications   No medications on file    Discontinued Medications   No medications on file     Note:  This document was prepared using Dragon voice recognition software and may include unintentional dictation errors.    Gregor Hams, MD 01/19/17 [phone]

## 2017-01-19 ENCOUNTER — Emergency Department: Payer: 59

## 2017-01-19 ENCOUNTER — Encounter: Payer: Self-pay | Admitting: Radiology

## 2017-01-19 DIAGNOSIS — E785 Hyperlipidemia, unspecified: Secondary | ICD-10-CM | POA: Diagnosis not present

## 2017-01-19 DIAGNOSIS — H7011 Chronic mastoiditis, right ear: Secondary | ICD-10-CM | POA: Diagnosis not present

## 2017-01-19 DIAGNOSIS — E119 Type 2 diabetes mellitus without complications: Secondary | ICD-10-CM | POA: Diagnosis not present

## 2017-01-19 DIAGNOSIS — R6510 Systemic inflammatory response syndrome (SIRS) of non-infectious origin without acute organ dysfunction: Secondary | ICD-10-CM | POA: Diagnosis not present

## 2017-01-19 DIAGNOSIS — R0600 Dyspnea, unspecified: Secondary | ICD-10-CM | POA: Diagnosis not present

## 2017-01-19 DIAGNOSIS — E876 Hypokalemia: Secondary | ICD-10-CM | POA: Diagnosis not present

## 2017-01-19 DIAGNOSIS — K7581 Nonalcoholic steatohepatitis (NASH): Secondary | ICD-10-CM | POA: Diagnosis not present

## 2017-01-19 DIAGNOSIS — K219 Gastro-esophageal reflux disease without esophagitis: Secondary | ICD-10-CM | POA: Diagnosis not present

## 2017-01-19 DIAGNOSIS — I10 Essential (primary) hypertension: Secondary | ICD-10-CM | POA: Diagnosis not present

## 2017-01-19 DIAGNOSIS — R05 Cough: Secondary | ICD-10-CM | POA: Diagnosis not present

## 2017-01-19 DIAGNOSIS — J209 Acute bronchitis, unspecified: Secondary | ICD-10-CM | POA: Diagnosis not present

## 2017-01-19 DIAGNOSIS — E860 Dehydration: Secondary | ICD-10-CM | POA: Diagnosis not present

## 2017-01-19 LAB — INFLUENZA PANEL BY PCR (TYPE A & B)
INFLAPCR: NEGATIVE
Influenza B By PCR: NEGATIVE

## 2017-01-19 LAB — CBC
HCT: 34.4 % — ABNORMAL LOW (ref 40.0–52.0)
Hemoglobin: 11.7 g/dL — ABNORMAL LOW (ref 13.0–18.0)
MCH: 25.1 pg — AB (ref 26.0–34.0)
MCHC: 33.9 g/dL (ref 32.0–36.0)
MCV: 74.2 fL — AB (ref 80.0–100.0)
PLATELETS: 314 10*3/uL (ref 150–440)
RBC: 4.64 MIL/uL (ref 4.40–5.90)
RDW: 15 % — AB (ref 11.5–14.5)
WBC: 6.7 10*3/uL (ref 3.8–10.6)

## 2017-01-19 LAB — HEPATIC FUNCTION PANEL
ALK PHOS: 94 U/L (ref 38–126)
ALT: 78 U/L — ABNORMAL HIGH (ref 17–63)
AST: 55 U/L — ABNORMAL HIGH (ref 15–41)
Albumin: 3.8 g/dL (ref 3.5–5.0)
BILIRUBIN TOTAL: 0.6 mg/dL (ref 0.3–1.2)
Total Protein: 7.2 g/dL (ref 6.5–8.1)

## 2017-01-19 LAB — BASIC METABOLIC PANEL
Anion gap: 8 (ref 5–15)
BUN: 10 mg/dL (ref 6–20)
CALCIUM: 8 mg/dL — AB (ref 8.9–10.3)
CHLORIDE: 109 mmol/L (ref 101–111)
CO2: 20 mmol/L — AB (ref 22–32)
CREATININE: 0.68 mg/dL (ref 0.61–1.24)
GFR calc non Af Amer: >60 mL/min (ref >60)
GLUCOSE: 197 mg/dL — AB (ref 65–99)
Potassium: 4.2 mmol/L (ref 3.5–5.1)
Sodium: 137 mmol/L (ref 135–145)

## 2017-01-19 LAB — FIBRIN DERIVATIVES D-DIMER (ARMC ONLY): FIBRIN DERIVATIVES D-DIMER (ARMC): 1582 — AB (ref 0–499)

## 2017-01-19 LAB — LACTIC ACID, PLASMA
LACTIC ACID, VENOUS: 1.5 mmol/L (ref 0.5–1.9)
LACTIC ACID, VENOUS: 2 mmol/L — AB (ref 0.5–1.9)
LACTIC ACID, VENOUS: 2.8 mmol/L — AB (ref 0.5–1.9)
LACTIC ACID, VENOUS: 7.1 mmol/L — AB (ref 0.5–1.9)

## 2017-01-19 LAB — GLUCOSE, CAPILLARY
GLUCOSE-CAPILLARY: 180 mg/dL — AB (ref 65–99)
Glucose-Capillary: 130 mg/dL — ABNORMAL HIGH (ref 65–99)

## 2017-01-19 LAB — LIPASE, BLOOD: Lipase: 23 U/L (ref 11–51)

## 2017-01-19 LAB — CK: Total CK: 95 U/L (ref 49–397)

## 2017-01-19 MED ORDER — PANTOPRAZOLE SODIUM 40 MG PO TBEC
40.0000 mg | DELAYED_RELEASE_TABLET | Freq: Every day | ORAL | Status: DC
  Administered 2017-01-19 – 2017-01-20 (×2): 40 mg via ORAL
  Filled 2017-01-19 (×2): qty 1

## 2017-01-19 MED ORDER — POTASSIUM CHLORIDE CRYS ER 20 MEQ PO TBCR: 40.0000 meq | EXTENDED_RELEASE_TABLET | Freq: Once | ORAL | Status: DC

## 2017-01-19 MED ORDER — ACETAMINOPHEN 650 MG RE SUPP: 650.0000 mg | Freq: Four times a day (QID) | RECTAL | Status: DC | PRN

## 2017-01-19 MED ORDER — VANCOMYCIN HCL IN DEXTROSE 1-5 GM/200ML-% IV SOLN
1000.0000 mg | Freq: Once | INTRAVENOUS | Status: AC
  Administered 2017-01-19: 1000 mg via INTRAVENOUS
  Filled 2017-01-19: qty 200

## 2017-01-19 MED ORDER — PREDNISONE 50 MG PO TABS: 50.0000 mg | ORAL_TABLET | Freq: Every day | ORAL | Status: DC

## 2017-01-19 MED ORDER — POTASSIUM CHLORIDE 20 MEQ PO PACK
40.0000 meq | PACK | Freq: Once | ORAL | Status: AC
  Administered 2017-01-19: 40 meq via ORAL
  Filled 2017-01-19: qty 2

## 2017-01-19 MED ORDER — PREDNISOLONE SODIUM PHOSPHATE 15 MG/5ML PO SOLN
50.0000 mg | Freq: Every day | ORAL | Status: DC
  Administered 2017-01-20: 50 mg via ORAL
  Filled 2017-01-19: qty 20

## 2017-01-19 MED ORDER — ALBUTEROL SULFATE (2.5 MG/3ML) 0.083% IN NEBU: 2.5000 mg | INHALATION_SOLUTION | RESPIRATORY_TRACT | Status: DC | PRN

## 2017-01-19 MED ORDER — PREDNISOLONE 5 MG PO TABS
50.0000 mg | ORAL_TABLET | Freq: Every day | ORAL | Status: DC
  Filled 2017-01-19: qty 10

## 2017-01-19 MED ORDER — SODIUM CHLORIDE 0.9 % IV BOLUS (SEPSIS)
1000.0000 mL | Freq: Once | INTRAVENOUS | Status: AC
  Administered 2017-01-19: 1000 mL via INTRAVENOUS

## 2017-01-19 MED ORDER — INSULIN ASPART 100 UNIT/ML ~~LOC~~ SOLN
0.0000 [IU] | Freq: Three times a day (TID) | SUBCUTANEOUS | Status: DC
  Administered 2017-01-19: 2 [IU] via SUBCUTANEOUS
  Administered 2017-01-19 – 2017-01-20 (×2): 1 [IU] via SUBCUTANEOUS
  Administered 2017-01-20: 12:00:00 2 [IU] via SUBCUTANEOUS
  Filled 2017-01-19 (×2): qty 1
  Filled 2017-01-19 (×2): qty 2

## 2017-01-19 MED ORDER — GUAIFENESIN-DM 100-10 MG/5ML PO SYRP
5.0000 mL | ORAL_SOLUTION | ORAL | Status: DC | PRN
  Administered 2017-01-19 – 2017-01-20 (×3): 5 mL via ORAL
  Filled 2017-01-19 (×3): qty 5

## 2017-01-19 MED ORDER — IOPAMIDOL (ISOVUE-370) INJECTION 76%
75.0000 mL | Freq: Once | INTRAVENOUS | Status: AC | PRN
  Administered 2017-01-19: 75 mL via INTRAVENOUS

## 2017-01-19 MED ORDER — HYDROCODONE-ACETAMINOPHEN 5-325 MG PO TABS: 1.0000 | ORAL_TABLET | ORAL | Status: DC | PRN

## 2017-01-19 MED ORDER — ONDANSETRON HCL 4 MG PO TABS: 4.0000 mg | ORAL_TABLET | Freq: Four times a day (QID) | ORAL | Status: DC | PRN

## 2017-01-19 MED ORDER — ONDANSETRON HCL 4 MG/2ML IJ SOLN: 4.0000 mg | Freq: Four times a day (QID) | INTRAMUSCULAR | Status: DC | PRN

## 2017-01-19 MED ORDER — PIPERACILLIN-TAZOBACTAM 3.375 G IVPB 30 MIN
3.3750 g | Freq: Once | INTRAVENOUS | Status: AC
  Administered 2017-01-19: 3.375 g via INTRAVENOUS
  Filled 2017-01-19: qty 50

## 2017-01-19 MED ORDER — ENOXAPARIN SODIUM 40 MG/0.4ML ~~LOC~~ SOLN
40.0000 mg | SUBCUTANEOUS | Status: DC
  Administered 2017-01-19 – 2017-01-20 (×2): 40 mg via SUBCUTANEOUS
  Filled 2017-01-19 (×3): qty 0.4

## 2017-01-19 MED ORDER — LEVOFLOXACIN 500 MG PO TABS
500.0000 mg | ORAL_TABLET | Freq: Every day | ORAL | Status: DC
  Administered 2017-01-19 – 2017-01-20 (×2): 500 mg via ORAL
  Filled 2017-01-19 (×2): qty 1

## 2017-01-19 MED ORDER — SODIUM CHLORIDE 0.9 % IV SOLN
INTRAVENOUS | Status: DC
  Administered 2017-01-19 – 2017-01-20 (×4): via INTRAVENOUS

## 2017-01-19 MED ORDER — ACETAMINOPHEN 325 MG PO TABS: 650.0000 mg | ORAL_TABLET | Freq: Four times a day (QID) | ORAL | Status: DC | PRN

## 2017-01-19 NOTE — ED Notes (Signed)
Patient transported to CT 

## 2017-01-19 NOTE — ED Notes (Signed)
RT at bedside to add humidification to his O2

## 2017-01-19 NOTE — ED Notes (Signed)
EDP notified of pt's decreased lactic.  EDP acknowledged.

## 2017-01-19 NOTE — H&P (Signed)
[facility] [place] NAME: Dalton Cole    MR#:  [id number]  DATE OF BIRTH:  [birth year]  DATE OF ADMISSION:  01/18/2017  PRIMARY CARE PHYSICIAN: Tracie Harrier, MD   REQUESTING/REFERRING PHYSICIAN:   CHIEF COMPLAINT:   Chief Complaint  Patient presents with  . Cough  . Influenza    HISTORY OF PRESENT ILLNESS: Dalton Cole  is a 41 y.o. male with a known history of Fatty liver disease hyperlipidemia, GERD presented to the emergency room with cough and sudden shortness of breath. Patient recently had flu and completed a course of Tamiflu 1 day ago. He was diagnosed with flu last Thursday when he returned from [facility]. He completed a course of Tamiflu medication. And yesterday evening when he was having dinner at home he felt some itching in the throat and felt suddenly short of breath was not able to take a deep breath. He was brought to the emergency room he received IV Solu-Medrol and nebulization treatment. He felt better. No evidence of any swelling of the tongue or any laryngeal edema. She was worked up with CT angiogram of the chest in the emergency room which showed no pulmonary embolism. His lactate level was elevated during the workup around 7.1. He was dry and dehydrated. Patient was tired and rundown after he had flu and had some generalized body aches. He was hydrated with close to 3 L of fluid in the emergency room. His repeat lactate level came down to 2. His WBC count during the workup is normal. Chest x-ray did not show any pneumonia. CT soft tissue neck showed no evidence of any abscess or swelling .Hospitalist service was consulted for further care of the patient.  PAST MEDICAL HISTORY:   Past Medical History:  Diagnosis Date  . Elevated liver enzymes   . Fatty liver   . GERD (gastroesophageal reflux disease)   . Hepatomegaly   . Hyperlipemia     PAST SURGICAL HISTORY: Past Surgical History:   Procedure Laterality Date  . none      SOCIAL HISTORY:  Social History  Substance Use Topics  . Smoking status: Never Smoker  . Smokeless tobacco: Never Used  . Alcohol use 0.0 oz/week     Comment: hx several drinks per week but quit 05/2015    FAMILY HISTORY:  Family History  Problem Relation Age of Onset  . Heart disease Mother   . Stroke Mother   . Diabetes Father   . Alcohol abuse Brother   . Colon cancer Neg Hx   . Liver disease Neg Hx     DRUG ALLERGIES: No Known Allergies  REVIEW OF SYSTEMS:   CONSTITUTIONAL: No fever, has weakness.  EYES: No blurred or double vision.  EARS, NOSE, AND THROAT: No tinnitus or ear pain.  Had some itching in the throat RESPIRATORY: Has cough, shortness of breath, no wheezing or hemoptysis.  CARDIOVASCULAR: No chest pain, orthopnea, edema.  GASTROINTESTINAL: No nausea, vomiting, diarrhea or abdominal pain.  GENITOURINARY: No dysuria, hematuria.  ENDOCRINE: No polyuria, nocturia,  HEMATOLOGY: No anemia, easy bruising or bleeding SKIN: No rash or lesion. MUSCULOSKELETAL: No joint pain or arthritis.   NEUROLOGIC: No tingling, numbness, weakness.  PSYCHIATRY: No anxiety or depression.   MEDICATIONS AT HOME:  Prior to Admission medications   Medication Sig Start Date End Date Taking? Authorizing Provider  lisinopril (ZESTRIL) 2.5 MG tablet Take 1 tablet (2.5 mg total) by mouth daily. 06/12/15   Dossie Der  Richmond Campbell, MD  metFORMIN (GLUCOPHAGE) 500 MG tablet Take 1 tablet (500 mg total) by mouth 2 (two) times daily with a meal. 06/12/15   Roselee Nova, MD  pantoprazole (PROTONIX) 40 MG tablet Take 1 tablet (40 mg total) by mouth daily. 06/02/15   Roselee Nova, MD      PHYSICAL EXAMINATION:   VITAL SIGNS: Blood pressure 119/70, pulse (!) 114, temperature 98 F (36.7 C), temperature source Oral, resp. rate 15, height 5' 8"  (1.727 m), weight 70.3 kg (155 lb), SpO2 94 %.  GENERAL:  41 y.o.-year-old patient lying in the bed with no  acute distress.  EYES: Pupils equal, round, reactive to light and accommodation. No scleral icterus. Extraocular muscles intact.  HEENT: Head atraumatic, normocephalic. Oropharynx dry and nasopharynx clear.  NECK:  Supple, no jugular venous distention. No thyroid enlargement, no tenderness.  LUNGS: Normal breath sounds bilaterally, no wheezing, rales,rhonchi or crepitation. No use of accessory muscles of respiration.  CARDIOVASCULAR: S1, S2 tachycardia noted. No murmurs, rubs, or gallops.  ABDOMEN: Soft, nontender, nondistended. Bowel sounds present. No organomegaly or mass.  EXTREMITIES: No pedal edema, cyanosis, or clubbing.  NEUROLOGIC: Cranial nerves II through XII are intact. Muscle strength 5/5 in all extremities. Sensation intact. Gait not checked.  PSYCHIATRIC: The patient is alert and oriented x 3.  SKIN: No obvious rash, lesion, or ulcer.   LABORATORY PANEL:   CBC  Recent Labs Lab 01/18/17 2300  WBC 11.0*  HGB 13.8  HCT 42.8  PLT 370  MCV 74.7*  MCH 24.1*  MCHC 32.3  RDW 14.8*  LYMPHSABS 5.5*  MONOABS 0.6  EOSABS 0.2  BASOSABS 0.1   ------------------------------------------------------------------------------------------------------------------  Chemistries   Recent Labs Lab 01/18/17 2300  NA 139  K 3.0*  CL 104  CO2 20*  GLUCOSE 134*  BUN 19  CREATININE 0.91  CALCIUM 9.4   ------------------------------------------------------------------------------------------------------------------ estimated creatinine clearance is 103.4 mL/min (by C-G formula based on SCr of 0.91 mg/dL). ------------------------------------------------------------------------------------------------------------------ No results for input(s): TSH, T4TOTAL, T3FREE, THYROIDAB in the last 72 hours.  Invalid input(s): FREET3   Coagulation profile No results for input(s): INR, PROTIME in the last 168  hours. ------------------------------------------------------------------------------------------------------------------- No results for input(s): DDIMER in the last 72 hours. -------------------------------------------------------------------------------------------------------------------  Cardiac Enzymes  Recent Labs Lab 01/18/17 2300  TROPONINI <0.03   ------------------------------------------------------------------------------------------------------------------ Invalid input(s): POCBNP  ---------------------------------------------------------------------------------------------------------------  Urinalysis No results found for: COLORURINE, APPEARANCEUR, LABSPEC, PHURINE, GLUCOSEU, HGBUR, BILIRUBINUR, KETONESUR, PROTEINUR, UROBILINOGEN, NITRITE, LEUKOCYTESUR   RADIOLOGY: Ct Soft Tissue Neck W Contrast  Result Date: 01/19/2017 CLINICAL DATA:  Diagnosed with flu last week, completed Tamiflu yesterday. Persistent cough, shortness of breath, throat swelling and pain. EXAM: CT NECK WITH CONTRAST TECHNIQUE: Multidetector CT imaging of the neck was performed using the standard protocol following the bolus administration of intravenous contrast. CONTRAST:  75 cc Isovue 370 COMPARISON:  None. FINDINGS: PHARYNX AND LARYNX: Normal, widely patent airway. SALIVARY GLANDS: Normal. THYROID: Normal. LYMPH NODES: No lymphadenopathy by CT size criteria. VASCULAR: Normal. LIMITED INTRACRANIAL: Normal. VISUALIZED ORBITS: Normal. MASTOIDS AND VISUALIZED PARANASAL SINUSES: Mild paranasal sinus mucosal thickening. RIGHT maxillary mucosal retention cyst. Chronic RIGHT mastoiditis with air cell coalescence and lateral dehiscence. Minimal chronic LEFT mastoid effusion. SKELETON: Nonacute. UPPER CHEST: Lung apices are clear. No superior mediastinal lymphadenopathy. OTHER: None. IMPRESSION: No acute process in the neck; widely patent airway. Chronic RIGHT mastoiditis with lateral bony wall dehiscence.  Recommend ENT consultation on a nonemergent basis. Electronically Signed   By: Thana Farr.D.  On: 01/19/2017 02:36   Ct Angio Chest Pe W And/or Wo Contrast  Result Date: 01/19/2017 CLINICAL DATA:  Cough, nonsmoker.  Dyspnea. EXAM: CT ANGIOGRAPHY CHEST WITH CONTRAST TECHNIQUE: Multidetector CT imaging of the chest was performed using the standard protocol during bolus administration of intravenous contrast. Multiplanar CT image reconstructions and MIPs were obtained to evaluate the vascular anatomy. CONTRAST:  75 cc Isovue 370 IV COMPARISON:  None. FINDINGS: Cardiovascular: The study is of quality for the evaluation of pulmonary embolism. There are no filling defects in the central, lobar, segmental or subsegmental pulmonary artery branches to suggest acute pulmonary embolism. Great vessels are normal in course and caliber. Normal heart size. No significant pericardial fluid/thickening. Mediastinum/Nodes: No discrete thyroid nodules. Unremarkable esophagus. No pathologically enlarged axillary, mediastinal or hilar lymph nodes. Lungs/Pleura: No pneumothorax. No pleural effusion. No pulmonary consolidation. Tiny subpleural bleb in the left lower lobe. Upper abdomen: Unremarkable. Musculoskeletal:  No aggressive appearing focal osseous lesions. Review of the MIP images confirms the above findings. IMPRESSION: No evidence of acute pulmonary embolus. No pneumonic consolidation, effusion, pneumothorax or evidence of CHF. Electronically Signed   By: Ashley Royalty M.D.   On: 01/19/2017 02:30   Dg Chest Port 1 View  Result Date: 01/18/2017 CLINICAL DATA:  Cough, diagnosed with flu last week EXAM: PORTABLE CHEST 1 VIEW COMPARISON:  Chest CT 09/16/2016, CXR 04/07/2015 FINDINGS: The heart size and mediastinal contours are within normal limits for an AP projection. Both lungs are clear. The visualized skeletal structures are unremarkable. IMPRESSION: No active disease. Electronically Signed   By: Ashley Royalty M.D.    On: 01/18/2017 22:55    EKG: Orders placed or performed during the hospital encounter of 01/18/17  . ED EKG  . ED EKG  . EKG 12-Lead  . EKG 12-Lead    IMPRESSION AND PLAN: 41 year old male patient with history of hyperlipidemia, GERD, fatty liver disease presented to the emergency room with cough and sinus shortness of breath. His lactate level is elevated. Admitting diagnosis 1. Systemic inflammatory response syndrome 2. Elevated lactate level 3. Hypokalemia 4. Dehydration 5. History of recent flu infection Treatment plan Admit patient to medical floor IV fluid hydration Follow up lactate level Check cultures Patient received first dose of antibiotics in the emergency room, we will hold off on antibiotics for now as no evidence of infection. We will follow up lactate level and cultures Replace potassium Check liver function tests Supportive care  All the records are reviewed and case discussed with ED provider. Management plans discussed with the patient, family and they are in agreement.  CODE STATUS:Full code Code Status History    This patient does not have a recorded code status. Please follow your organizational policy for patients in this situation.       TOTAL TIME TAKING CARE OF THIS PATIENT: 52 minutes.    Saundra Shelling M.D on 01/19/2017 at 5:04 AM  Between 7am to 6pm - Pager - [phone]  After 6pm go to [email or web address] - password [email or web address] [facility]  Office  [phone]  CC: Primary care physician; Tracie Harrier, MD

## 2017-01-19 NOTE — Progress Notes (Signed)
Notified Dr Posey Pronto that patient had lactic acid of 2.8. MD acknowledged, no new orders given.

## 2017-01-19 NOTE — Progress Notes (Signed)
[facility] - [place] at [facility]                                                                                                                                                                                  Patient Demographics   Dalton Cole, is a 41 y.o. male, DOB - 1976-10-29, YQI:[id number]  Admit date - 01/18/2017   Admitting Physician Saundra Shelling, MD  Outpatient Primary MD for the patient is Tracie Harrier, MD   LOS - 0  Subjective: Patient Admitted with acute onset of shortness of breath. Patient had a CT scan which shows no infection or pneumonia. He complains of some abdominal discomfort going up to his chest. But no fevers or chills. His nausea and vomiting have since resolved. He denies any chest pain or palpitations.    Review of Systems:   CONSTITUTIONAL: No documented fever. No fatigue, weakness. No weight gain, no weight loss.  EYES: No blurry or double vision.  ENT: No tinnitus. No postnasal drip. No redness of the oropharynx.  RESPIRATORY: No cough, no wheeze, no hemoptysis. No dyspnea.  CARDIOVASCULAR: No chest pain. No orthopnea. No palpitations. No syncope.  GASTROINTESTINAL: nausea, no vomiting or diarrhea. Vague abdominal pain. No melena or hematochezia.  GENITOURINARY: No dysuria or hematuria.  ENDOCRINE: No polyuria or nocturia. No heat or cold intolerance.  HEMATOLOGY: No anemia. No bruising. No bleeding.  INTEGUMENTARY: No rashes. No lesions.  MUSCULOSKELETAL: No arthritis. No swelling. No gout.  NEUROLOGIC: No numbness, tingling, or ataxia. No seizure-type activity.  PSYCHIATRIC: No anxiety. No insomnia. No ADD.    Vitals:   Vitals:   01/19/17 0530 01/19/17 0618 01/19/17 0619 01/19/17 1234  BP: 96/65 119/79  103/62  Pulse: 80 79  81  Resp: 18 20  18   Temp:  97.9 F (36.6 C)  97.7 F (36.5 C)  TempSrc:  Oral  Oral  SpO2: 98% 97%  100%  Weight:   151 lb 11.2 oz (68.8 kg)   Height:   5' 8"  (1.727 m)     Wt  Readings from Last 3 Encounters:  01/19/17 151 lb 11.2 oz (68.8 kg)  06/12/15 155 lb (70.3 kg)  06/05/15 154 lb (69.9 kg)     Intake/Output Summary (Last 24 hours) at 01/19/17 1406 Last data filed at 01/19/17 0900  Gross per 24 hour  Intake           3572.5 ml  Output                0 ml  Net           3572.5 ml    Physical Exam:   GENERAL: Pleasant-appearing in  no apparent distress.  HEAD, EYES, EARS, NOSE AND THROAT: Atraumatic, normocephalic. Extraocular muscles are intact. Pupils equal and reactive to light. Sclerae anicteric. No conjunctival injection. No oro-pharyngeal erythema.  NECK: Supple. There is no jugular venous distention. No bruits, no lymphadenopathy, no thyromegaly.  HEART: Regular rate and rhythm,. No murmurs, no rubs, no clicks.  LUNGS: Clear to auscultation bilaterally. No rales or rhonchi. No wheezes.  ABDOMEN: Soft, flat, nontender, nondistended. Has good bowel sounds. No hepatosplenomegaly appreciated.  EXTREMITIES: No evidence of any cyanosis, clubbing, or peripheral edema.  +2 pedal and radial pulses bilaterally.  NEUROLOGIC: The patient is alert, awake, and oriented x3 with no focal motor or sensory deficits appreciated bilaterally.  SKIN: Moist and warm with no rashes appreciated.  Psych: Not anxious, depressed LN: No inguinal LN enlargement    Antibiotics   Anti-infectives    Start     Dose/Rate Route Frequency Ordered Stop   01/19/17 1130  levofloxacin (LEVAQUIN) tablet 500 mg     500 mg Oral Daily 01/19/17 1127     01/19/17 0315  vancomycin (VANCOCIN) IVPB 1000 mg/200 mL premix     1,000 mg 200 mL/hr over 60 Minutes Intravenous  Once 01/19/17 0308 01/19/17 0551   01/19/17 0315  piperacillin-tazobactam (ZOSYN) IVPB 3.375 g     3.375 g 100 mL/hr over 30 Minutes Intravenous  Once 01/19/17 0308 01/19/17 0347      Medications   Scheduled Meds: . enoxaparin (LOVENOX) injection  40 mg Subcutaneous Q24H  . insulin aspart  0-9 Units Subcutaneous  TID WC  . levofloxacin  500 mg Oral Daily  . pantoprazole  40 mg Oral QAC breakfast  . potassium chloride  40 mEq Oral Once  . [START ON 01/20/2017] prednisoLONE  50 mg Oral QAC breakfast   Continuous Infusions: . sodium chloride 150 mL/hr at 01/19/17 1340   PRN Meds:.acetaminophen **OR** acetaminophen, albuterol, guaiFENesin-dextromethorphan, HYDROcodone-acetaminophen, ondansetron **OR** ondansetron (ZOFRAN) IV   Data Review:   Micro Results Recent Results (from the past 240 hour(s))  Culture, blood (routine x 2)     Status: None (Preliminary result)   Collection Time: 01/18/17 11:00 PM  Result Value Ref Range Status   Specimen Description BLOOD  L AC   Final   Special Requests   Final    BOTTLES DRAWN AEROBIC AND ANAEROBIC  AER 11 ML ANA 13 ML   Culture NO GROWTH < 12 HOURS  Final   Report Status PENDING  Incomplete  Culture, blood (routine x 2)     Status: None (Preliminary result)   Collection Time: 01/18/17 11:00 PM  Result Value Ref Range Status   Specimen Description BLOOD  R AC   Final   Special Requests   Final    BOTTLES DRAWN AEROBIC AND ANAEROBIC  AER 15 ML ANA 11 ML   Culture NO GROWTH < 12 HOURS  Final   Report Status PENDING  Incomplete    Radiology Reports Ct Soft Tissue Neck W Contrast  Result Date: 01/19/2017 CLINICAL DATA:  Diagnosed with flu last week, completed Tamiflu yesterday. Persistent cough, shortness of breath, throat swelling and pain. EXAM: CT NECK WITH CONTRAST TECHNIQUE: Multidetector CT imaging of the neck was performed using the standard protocol following the bolus administration of intravenous contrast. CONTRAST:  75 cc Isovue 370 COMPARISON:  None. FINDINGS: PHARYNX AND LARYNX: Normal, widely patent airway. SALIVARY GLANDS: Normal. THYROID: Normal. LYMPH NODES: No lymphadenopathy by CT size criteria. VASCULAR: Normal. LIMITED INTRACRANIAL: Normal. VISUALIZED ORBITS: Normal. MASTOIDS  AND VISUALIZED PARANASAL SINUSES: Mild paranasal sinus mucosal  thickening. RIGHT maxillary mucosal retention cyst. Chronic RIGHT mastoiditis with air cell coalescence and lateral dehiscence. Minimal chronic LEFT mastoid effusion. SKELETON: Nonacute. UPPER CHEST: Lung apices are clear. No superior mediastinal lymphadenopathy. OTHER: None. IMPRESSION: No acute process in the neck; widely patent airway. Chronic RIGHT mastoiditis with lateral bony wall dehiscence. Recommend ENT consultation on a nonemergent basis. Electronically Signed   By: Elon Alas M.D.   On: 01/19/2017 02:36   Ct Angio Chest Pe W And/or Wo Contrast  Result Date: 01/19/2017 CLINICAL DATA:  Cough, nonsmoker.  Dyspnea. EXAM: CT ANGIOGRAPHY CHEST WITH CONTRAST TECHNIQUE: Multidetector CT imaging of the chest was performed using the standard protocol during bolus administration of intravenous contrast. Multiplanar CT image reconstructions and MIPs were obtained to evaluate the vascular anatomy. CONTRAST:  75 cc Isovue 370 IV COMPARISON:  None. FINDINGS: Cardiovascular: The study is of quality for the evaluation of pulmonary embolism. There are no filling defects in the central, lobar, segmental or subsegmental pulmonary artery branches to suggest acute pulmonary embolism. Great vessels are normal in course and caliber. Normal heart size. No significant pericardial fluid/thickening. Mediastinum/Nodes: No discrete thyroid nodules. Unremarkable esophagus. No pathologically enlarged axillary, mediastinal or hilar lymph nodes. Lungs/Pleura: No pneumothorax. No pleural effusion. No pulmonary consolidation. Tiny subpleural bleb in the left lower lobe. Upper abdomen: Unremarkable. Musculoskeletal:  No aggressive appearing focal osseous lesions. Review of the MIP images confirms the above findings. IMPRESSION: No evidence of acute pulmonary embolus. No pneumonic consolidation, effusion, pneumothorax or evidence of CHF. Electronically Signed   By: Ashley Royalty M.D.   On: 01/19/2017 02:30   Dg Chest Port 1  View  Result Date: 01/18/2017 CLINICAL DATA:  Cough, diagnosed with flu last week EXAM: PORTABLE CHEST 1 VIEW COMPARISON:  Chest CT 09/16/2016, CXR 04/07/2015 FINDINGS: The heart size and mediastinal contours are within normal limits for an AP projection. Both lungs are clear. The visualized skeletal structures are unremarkable. IMPRESSION: No active disease. Electronically Signed   By: Ashley Royalty M.D.   On: 01/18/2017 22:55     CBC  Recent Labs Lab 01/18/17 2300 01/19/17 0849  WBC 11.0* 6.7  HGB 13.8 11.7*  HCT 42.8 34.4*  PLT 370 314  MCV 74.7* 74.2*  MCH 24.1* 25.1*  MCHC 32.3 33.9  RDW 14.8* 15.0*  LYMPHSABS 5.5*  --   MONOABS 0.6  --   EOSABS 0.2  --   BASOSABS 0.1  --     Chemistries   Recent Labs Lab 01/18/17 2300 01/19/17 0849  NA 139 137  K 3.0* 4.2  CL 104 109  CO2 20* 20*  GLUCOSE 134* 197*  BUN 19 10  CREATININE 0.91 0.68  CALCIUM 9.4 8.0*  AST  --  55*  ALT  --  78*  ALKPHOS  --  94  BILITOT  --  0.6   ------------------------------------------------------------------------------------------------------------------ estimated creatinine clearance is 117.6 mL/min (by C-G formula based on SCr of 0.68 mg/dL). ------------------------------------------------------------------------------------------------------------------ No results for input(s): HGBA1C in the last 72 hours. ------------------------------------------------------------------------------------------------------------------ No results for input(s): CHOL, HDL, LDLCALC, TRIG, CHOLHDL, LDLDIRECT in the last 72 hours. ------------------------------------------------------------------------------------------------------------------ No results for input(s): TSH, T4TOTAL, T3FREE, THYROIDAB in the last 72 hours.  Invalid input(s): FREET3 ------------------------------------------------------------------------------------------------------------------ No results for input(s): VITAMINB12, FOLATE,  FERRITIN, TIBC, IRON, RETICCTPCT in the last 72 hours.  Coagulation profile No results for input(s): INR, PROTIME in the last 168 hours.  No results for input(s): DDIMER in the last  72 hours.  Cardiac Enzymes  Recent Labs Lab 01/18/17 2300  TROPONINI <0.03   ------------------------------------------------------------------------------------------------------------------ Invalid input(s): POCBNP    Assessment & Plan  Patient is a 41 year old male with history of hyperlipidemia, GERD, fatty liver disease and diabetes presents to the ED with complaint of cough and some shortness of breath 1. Systemic inflammatory response syndrome etiology unclear fluid has been ruled out. CT scan showed no evidence of pneumonia, I will continue IV fluids. Follow his lactic acid level, follow blood cultures Also complains of cough I will treat for acute bronchitis as well as oral prednisone  2. Elevated lactate level due to #1 hold metfomin 3. Hypokalemia being replaced 4. Dehydration IVF 5. History of recent flu infection flu recheck neg 6. dmII di diabetic diet and will start sliding scale insulin     Code Status Orders        Start     Ordered   01/19/17 0618  Full code  Continuous     01/19/17 0617    Code Status History    Date Active Date Inactive Code Status Order ID Comments User Context   This patient has a current code status but no historical code status.           Consults  None   DVT Prophylaxis  Lovenox  Lab Results  Component Value Date   PLT 314 01/19/2017     Time Spent in minutes  90mn  Greater than 50% of time spent in care coordination and counseling patient regarding the condition and plan of care.   PDustin FlockM.D on 01/19/2017 at 2:06 PM  Between 7am to 6pm - Pager - [phone]  After 6pm go to [email or web address] - password [email or web address]   Office  [phone]

## 2017-01-19 NOTE — Progress Notes (Signed)
Humidity bottle placed on O2

## 2017-01-20 DIAGNOSIS — E785 Hyperlipidemia, unspecified: Secondary | ICD-10-CM | POA: Diagnosis not present

## 2017-01-20 DIAGNOSIS — R6510 Systemic inflammatory response syndrome (SIRS) of non-infectious origin without acute organ dysfunction: Secondary | ICD-10-CM | POA: Diagnosis not present

## 2017-01-20 DIAGNOSIS — J209 Acute bronchitis, unspecified: Secondary | ICD-10-CM | POA: Diagnosis not present

## 2017-01-20 DIAGNOSIS — E876 Hypokalemia: Secondary | ICD-10-CM | POA: Diagnosis not present

## 2017-01-20 DIAGNOSIS — R0600 Dyspnea, unspecified: Secondary | ICD-10-CM | POA: Diagnosis not present

## 2017-01-20 DIAGNOSIS — H7011 Chronic mastoiditis, right ear: Secondary | ICD-10-CM | POA: Diagnosis not present

## 2017-01-20 DIAGNOSIS — E860 Dehydration: Secondary | ICD-10-CM | POA: Diagnosis not present

## 2017-01-20 DIAGNOSIS — I10 Essential (primary) hypertension: Secondary | ICD-10-CM | POA: Diagnosis not present

## 2017-01-20 DIAGNOSIS — K7581 Nonalcoholic steatohepatitis (NASH): Secondary | ICD-10-CM | POA: Diagnosis not present

## 2017-01-20 DIAGNOSIS — K219 Gastro-esophageal reflux disease without esophagitis: Secondary | ICD-10-CM | POA: Diagnosis not present

## 2017-01-20 DIAGNOSIS — E119 Type 2 diabetes mellitus without complications: Secondary | ICD-10-CM | POA: Diagnosis not present

## 2017-01-20 LAB — GLUCOSE, CAPILLARY
Glucose-Capillary: 131 mg/dL — ABNORMAL HIGH (ref 65–99)
Glucose-Capillary: 172 mg/dL — ABNORMAL HIGH (ref 65–99)

## 2017-01-20 MED ORDER — MENTHOL 3 MG MT LOZG
1.0000 | LOZENGE | OROMUCOSAL | Status: DC | PRN
Start: 2017-01-20 — End: 2017-01-20
  Administered 2017-01-20: 3 mg via ORAL
  Filled 2017-01-20: qty 9

## 2017-01-20 MED ORDER — PREDNISONE 10 MG (21) PO TBPK: 10.0000 mg | ORAL_TABLET | Freq: Every day | ORAL | 0 refills | Status: DC

## 2017-01-20 MED ORDER — GUAIFENESIN-CODEINE 100-10 MG/5ML PO SOLN
10.0000 mL | ORAL | Status: DC | PRN
  Administered 2017-01-20: 10 mL via ORAL
  Filled 2017-01-20: qty 10

## 2017-01-20 MED ORDER — LEVOFLOXACIN 500 MG PO TABS: 500.0000 mg | ORAL_TABLET | Freq: Every day | ORAL | 0 refills | Status: DC

## 2017-01-20 MED ORDER — GUAIFENESIN-CODEINE 100-10 MG/5ML PO SOLN: 10.0000 mL | ORAL | 0 refills | Status: DC | PRN

## 2017-01-20 MED ORDER — GUAIFENESIN ER 600 MG PO TB12: 600.0000 mg | ORAL_TABLET | Freq: Two times a day (BID) | ORAL | 0 refills | Status: DC

## 2017-01-20 NOTE — Discharge Summary (Signed)
Dalton Cole, 41 y.o., DOB 03/05/1976, MRN [id number]. Admission date: 01/18/2017 Discharge Date 01/20/2017 Primary MD Tracie Harrier, MD Admitting Physician Saundra Shelling, MD  Admission Diagnosis  Cough [R05]  Discharge Diagnosis   Active Problems: SIRS (systemic inflammatory response syndrome) (HCC) Acute bronchitis Essential hypertension Karlene Lineman Diabetes type [place]  is a 41 y.o. male with a known history of Fatty liver disease hyperlipidemia, GERD presented to the emergency room with cough and sudden shortness of breath. Patient recently had flu and completed a course of Tamiflu one day prior. And yesterday evening when he was having dinner at home he felt some itching in the throat and felt suddenly short of breath was not able to take a deep breath. He was brought to the emergency room he received IV Solu-Medrol and nebulization treatment patient had a CT scan of her chest and neck which showed no pathology. Patient continued to complain of sore throat and coughing. He had the flu test checked again which was negative. Patient was treated with prednisone and oral antibiotics. His symptoms are improved.            Consults  None  Significant Tests:  See full reports for all details     Ct Soft Tissue Neck W Contrast  Result Date: 01/19/2017 CLINICAL DATA:  Diagnosed with flu last week, completed Tamiflu yesterday. Persistent cough, shortness of breath, throat swelling and pain. EXAM: CT NECK WITH CONTRAST TECHNIQUE: Multidetector CT imaging of the neck was performed using the standard protocol following the bolus administration of intravenous contrast. CONTRAST:  75 cc Isovue 370 COMPARISON:  None. FINDINGS: PHARYNX AND LARYNX: Normal, widely patent airway. SALIVARY GLANDS: Normal. THYROID: Normal. LYMPH NODES: No lymphadenopathy by CT size criteria. VASCULAR: Normal.  LIMITED INTRACRANIAL: Normal. VISUALIZED ORBITS: Normal. MASTOIDS AND VISUALIZED PARANASAL SINUSES: Mild paranasal sinus mucosal thickening. RIGHT maxillary mucosal retention cyst. Chronic RIGHT mastoiditis with air cell coalescence and lateral dehiscence. Minimal chronic LEFT mastoid effusion. SKELETON: Nonacute. UPPER CHEST: Lung apices are clear. No superior mediastinal lymphadenopathy. OTHER: None. IMPRESSION: No acute process in the neck; widely patent airway. Chronic RIGHT mastoiditis with lateral bony wall dehiscence. Recommend ENT consultation on a nonemergent basis. Electronically Signed   By: Elon Alas M.D.   On: 01/19/2017 02:36   Ct Angio Chest Pe W And/or Wo Contrast  Result Date: 01/19/2017 CLINICAL DATA:  Cough, nonsmoker.  Dyspnea. EXAM: CT ANGIOGRAPHY CHEST WITH CONTRAST TECHNIQUE: Multidetector CT imaging of the chest was performed using the standard protocol during bolus administration of intravenous contrast. Multiplanar CT image reconstructions and MIPs were obtained to evaluate the vascular anatomy. CONTRAST:  75 cc Isovue 370 IV COMPARISON:  None. FINDINGS: Cardiovascular: The study is of quality for the evaluation of pulmonary embolism. There are no filling defects in the central, lobar, segmental or subsegmental pulmonary artery branches to suggest acute pulmonary embolism. Great vessels are normal in course and caliber. Normal heart size. No significant pericardial fluid/thickening. Mediastinum/Nodes: No discrete thyroid nodules. Unremarkable esophagus. No pathologically enlarged axillary, mediastinal or hilar lymph nodes. Lungs/Pleura: No pneumothorax. No pleural effusion. No pulmonary consolidation. Tiny subpleural bleb in the left lower lobe. Upper abdomen: Unremarkable. Musculoskeletal:  No aggressive appearing focal osseous lesions. Review of the MIP images confirms the above findings. IMPRESSION: No evidence of acute pulmonary embolus. No pneumonic consolidation, effusion,  pneumothorax or evidence of CHF. Electronically Signed   By: Shanon Brow  Randel Pigg M.D.   On: 01/19/2017 02:30   Dg Chest Port 1 View  Result Date: 01/18/2017 CLINICAL DATA:  Cough, diagnosed with flu last week EXAM: PORTABLE CHEST 1 VIEW COMPARISON:  Chest CT 09/16/2016, CXR 04/07/2015 FINDINGS: The heart size and mediastinal contours are within normal limits for an AP projection. Both lungs are clear. The visualized skeletal structures are unremarkable. IMPRESSION: No active disease. Electronically Signed   By: Ashley Royalty M.D.   On: 01/18/2017 22:55       Today   Subjective:   Dalton Cole  patient still has some sore throat and cough but feeling much better  Objective:   Blood pressure 128/80, pulse 91, temperature 98.2 F (36.8 C), temperature source Oral, resp. rate 18, height 5' 8"  (1.727 m), weight 151 lb 11.2 oz (68.8 kg), SpO2 99 %.  .  Intake/Output Summary (Last 24 hours) at 01/20/17 1354 Last data filed at 01/20/17 0900  Gross per 24 hour  Intake           3252.5 ml  Output                0 ml  Net           3252.5 ml    Exam VITAL SIGNS: Blood pressure 128/80, pulse 91, temperature 98.2 F (36.8 C), temperature source Oral, resp. rate 18, height 5' 8"  (1.727 m), weight 151 lb 11.2 oz (68.8 kg), SpO2 99 %.  GENERAL:  41 y.o.-year-old patient lying in the bed with no acute distress.  EYES: Pupils equal, round, reactive to light and accommodation. No scleral icterus. Extraocular muscles intact.  HEENT: Head atraumatic, normocephalic. Oropharynx and nasopharynx clear.  NECK:  Supple, no jugular venous distention. No thyroid enlargement, no tenderness.  LUNGS: Normal breath sounds bilaterally, no wheezing, rales,rhonchi or crepitation. No use of accessory muscles of respiration.  CARDIOVASCULAR: S1, S2 normal. No murmurs, rubs, or gallops.  ABDOMEN: Soft, nontender, nondistended. Bowel sounds present. No organomegaly or mass.  EXTREMITIES: No pedal edema, cyanosis,  or clubbing.  NEUROLOGIC: Cranial nerves II through XII are intact. Muscle strength 5/5 in all extremities. Sensation intact. Gait not checked.  PSYCHIATRIC: The patient is alert and oriented x 3.  SKIN: No obvious rash, lesion, or ulcer.   Data Review     CBC w Diff: Lab Results  Component Value Date   WBC 6.7 01/19/2017   HGB 11.7 (L) 01/19/2017   HCT 34.4 (L) 01/19/2017   HCT 41.9 06/02/2015   PLT 314 01/19/2017   PLT 392 (H) 06/02/2015   LYMPHOPCT 49 01/18/2017   MONOPCT 6 01/18/2017   EOSPCT 2 01/18/2017   BASOPCT 1 01/18/2017   CMP: Lab Results  Component Value Date   NA 137 01/19/2017   NA 140 06/02/2015   K 4.2 01/19/2017   CL 109 01/19/2017   CO2 20 (L) 01/19/2017   BUN 10 01/19/2017   BUN 12 06/02/2015   CREATININE 0.68 01/19/2017   PROT 7.2 01/19/2017   PROT 7.6 06/02/2015   ALBUMIN 3.8 01/19/2017   ALBUMIN 4.4 06/02/2015   BILITOT 0.6 01/19/2017   BILITOT 0.5 06/02/2015   ALKPHOS 94 01/19/2017   AST 55 (H) 01/19/2017   ALT 78 (H) 01/19/2017  .  Micro Results Recent Results (from the past 240 hour(s))  Culture, blood (routine x 2)     Status: None (Preliminary result)   Collection Time: 01/18/17 11:00 PM  Result Value Ref Range Status   Specimen Description BLOOD  L AC   Final   Special Requests   Final    BOTTLES DRAWN AEROBIC AND ANAEROBIC  AER 11 ML ANA 13 ML   Culture NO GROWTH 2 DAYS  Final   Report Status PENDING  Incomplete  Culture, blood (routine x 2)     Status: None (Preliminary result)   Collection Time: 01/18/17 11:00 PM  Result Value Ref Range Status   Specimen Description BLOOD  R AC   Final   Special Requests   Final    BOTTLES DRAWN AEROBIC AND ANAEROBIC  AER 15 ML ANA 11 ML   Culture NO GROWTH 2 DAYS  Final   Report Status PENDING  Incomplete        Code Status Orders        Start     Ordered   01/19/17 0618  Full code  Continuous     01/19/17 0617    Code Status History    Date Active Date Inactive Code Status  Order ID Comments User Context   This patient has a current code status but no historical code status.          Follow-up Information    HANDE,VISHWANATH, MD Follow up in 1 week(s).   Specialty:  Internal Medicine Contact information: [address] [place] [address] [phone]           Discharge Medications   Allergies as of 01/20/2017   No Known Allergies     Medication List    TAKE these medications   aspirin EC 81 MG tablet Take 81 mg by mouth daily.   atorvastatin 10 MG tablet Commonly known as:  LIPITOR Take 10 mg by mouth 2 (two) times a week.   guaiFENesin 600 MG 12 hr tablet Commonly known as:  MUCINEX Take 1 tablet (600 mg total) by mouth 2 (two) times daily.   guaiFENesin-codeine 100-10 MG/5ML syrup Take 10 mLs by mouth every 4 (four) hours as needed for cough.   levofloxacin 500 MG tablet Commonly known as:  LEVAQUIN Take 1 tablet (500 mg total) by mouth daily. Start taking on:  01/21/2017   lisinopril 2.5 MG tablet Commonly known as:  ZESTRIL Take 1 tablet (2.5 mg total) by mouth daily.   metFORMIN 500 MG tablet Commonly known as:  GLUCOPHAGE Take 1 tablet (500 mg total) by mouth 2 (two) times daily with a meal.   pantoprazole 40 MG tablet Commonly known as:  PROTONIX Take 1 tablet (40 mg total) by mouth daily.   predniSONE 10 MG (21) Tbpk tablet Commonly known as:  STERAPRED UNI-PAK 21 TAB Take 1 tablet (10 mg total) by mouth daily. Start at 60 mg taper by 52m until complete          Total Time in preparing paper work, data evaluation and todays exam - 35 minutes  PDustin FlockM.D on 01/20/2017 at 1[place]PM  [facility]   Office  [phone]

## 2017-01-20 NOTE — Discharge Instructions (Signed)
[facility] at [place]:  Diabetic diet, low sodium diet  DISCHARGE CONDITION:  Stable  ACTIVITY:  Activity as tolerated  OXYGEN:  Home Oxygen: No.   Oxygen Delivery: room air  DISCHARGE LOCATION:  home    ADDITIONAL DISCHARGE INSTRUCTION:   If you experience worsening of your admission symptoms, develop shortness of breath, life threatening emergency, suicidal or homicidal thoughts you must seek medical attention immediately by calling 911 or calling your MD immediately  if symptoms less severe.  You Must read complete instructions/literature along with all the possible adverse reactions/side effects for all the Medicines you take and that have been prescribed to you. Take any new Medicines after you have completely understood and accpet all the possible adverse reactions/side effects.   Please note  You were cared for by a hospitalist during your hospital stay. If you have any questions about your discharge medications or the care you received while you were in the hospital after you are discharged, you can call the unit and asked to speak with the hospitalist on call if the hospitalist that took care of you is not available. Once you are discharged, your primary care physician will handle any further medical issues. Please note that NO REFILLS for any discharge medications will be authorized once you are discharged, as it is imperative that you return to your primary care physician (or establish a relationship with a primary care physician if you do not have one) for your aftercare needs so that they can reassess your need for medications and monitor your lab values.

## 2017-01-20 NOTE — Progress Notes (Signed)
Discussed discharge instructions and medications with pt. IV removed. All questions addressed. Pt transported home via car by his friend.  Clarise Cruz, RN

## 2017-01-23 LAB — CULTURE, BLOOD (ROUTINE X 2)
Culture: NO GROWTH
Culture: NO GROWTH

## 2017-01-25 DIAGNOSIS — Z1159 Encounter for screening for other viral diseases: Secondary | ICD-10-CM | POA: Diagnosis not present

## 2017-01-30 DIAGNOSIS — R071 Chest pain on breathing: Secondary | ICD-10-CM | POA: Diagnosis not present

## 2017-01-30 DIAGNOSIS — Z8709 Personal history of other diseases of the respiratory system: Secondary | ICD-10-CM | POA: Diagnosis not present

## 2017-01-30 DIAGNOSIS — Z09 Encounter for follow-up examination after completed treatment for conditions other than malignant neoplasm: Secondary | ICD-10-CM | POA: Diagnosis not present

## 2017-01-30 DIAGNOSIS — E119 Type 2 diabetes mellitus without complications: Secondary | ICD-10-CM | POA: Diagnosis not present

## 2017-03-01 DIAGNOSIS — G4733 Obstructive sleep apnea (adult) (pediatric): Secondary | ICD-10-CM | POA: Diagnosis not present

## 2017-03-28 DIAGNOSIS — E119 Type 2 diabetes mellitus without complications: Secondary | ICD-10-CM | POA: Diagnosis not present

## 2017-03-28 DIAGNOSIS — K219 Gastro-esophageal reflux disease without esophagitis: Secondary | ICD-10-CM | POA: Diagnosis not present

## 2017-03-28 DIAGNOSIS — E785 Hyperlipidemia, unspecified: Secondary | ICD-10-CM | POA: Diagnosis not present

## 2017-03-28 DIAGNOSIS — R945 Abnormal results of liver function studies: Secondary | ICD-10-CM | POA: Diagnosis not present

## 2017-04-12 DIAGNOSIS — K219 Gastro-esophageal reflux disease without esophagitis: Secondary | ICD-10-CM | POA: Diagnosis not present

## 2017-04-12 DIAGNOSIS — Z0000 Encounter for general adult medical examination without abnormal findings: Secondary | ICD-10-CM | POA: Diagnosis not present

## 2017-04-12 DIAGNOSIS — E119 Type 2 diabetes mellitus without complications: Secondary | ICD-10-CM | POA: Diagnosis not present

## 2017-04-12 DIAGNOSIS — R7989 Other specified abnormal findings of blood chemistry: Secondary | ICD-10-CM | POA: Diagnosis not present

## 2017-07-13 ENCOUNTER — Ambulatory Visit (INDEPENDENT_AMBULATORY_CARE_PROVIDER_SITE_OTHER): Payer: 59 | Admitting: General Surgery

## 2017-07-13 ENCOUNTER — Encounter: Payer: Self-pay | Admitting: General Surgery

## 2017-07-13 VITALS — BP 106/62 | HR 85 | Resp 10 | Ht 68.0 in | Wt 153.0 lb

## 2017-07-13 DIAGNOSIS — R1031 Right lower quadrant pain: Secondary | ICD-10-CM | POA: Diagnosis not present

## 2017-07-13 DIAGNOSIS — R197 Diarrhea, unspecified: Secondary | ICD-10-CM

## 2017-07-13 MED ORDER — POLYETHYLENE GLYCOL 3350 17 GM/SCOOP PO POWD: ORAL | 0 refills | Status: AC

## 2017-07-13 NOTE — Progress Notes (Signed)
Patient ID: Dalton Cole, male   DOB: September 09, 1976, 41 y.o.   MRN: [id number]  Chief Complaint  Patient presents with  . Abdominal Pain    HPI Dalton Cole is a 41 y.o. male.  Here for evaluation of abdominal pain. The pain is in the right side lower rib cage lasting a couple of days, last episode 3 weeks ago.  He states the main issue is that he is having watery diarrhea (30 minutes) after meals. The squeezing pain is lower right abdominal that comes and goes. No particular foods but greasy and spicy does seem to make the diarrhea worse. He states this has been going on for at least 3 years.  He did visit [place] back in December and when he got back he tried a probiotic, but no relief. Denies any injury or trauma.  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  His wife is a Therapist, sports in the operating room at [employer].  HPI  Past Medical History:  Diagnosis Date  . Diabetes mellitus without complication ([place]) 5809  . Elevated liver enzymes   . Fatty liver   . GERD (gastroesophageal reflux disease)   . Hepatomegaly   . Hyperlipemia     Past Surgical History:  Procedure Laterality Date  . COLONOSCOPY  2013?   bloody stool/[place]  . EAR CANALOPLASTY    . NASAL SEPTUM SURGERY      Family History  Problem Relation Age of Onset  . Heart disease Mother   . Stroke Mother   . Diabetes Father   . Alcohol abuse Brother   . Colon cancer Neg Hx   . Liver disease Neg Hx     Social History Social History  Substance Use Topics  . Smoking status: Never Smoker  . Smokeless tobacco: Never Used  . Alcohol use 0.0 oz/week     Comment: hx several drinks per week but quit 05/2015    No Known Allergies  Current Outpatient Prescriptions  Medication Sig Dispense Refill  . aspirin EC 81 MG tablet Take 81 mg by mouth daily.    Marland Kitchen atorvastatin (LIPITOR) 10 MG tablet Take 10 mg by mouth 2 (two) times a week.    Marland Kitchen lisinopril (ZESTRIL) 2.5 MG tablet Take 1 tablet (2.5 mg total) by mouth daily.  90 tablet 1  . Multiple Vitamin (MULTI-VITAMINS) TABS Take by mouth.    . pantoprazole (PROTONIX) 40 MG tablet Take 1 tablet (40 mg total) by mouth daily. 90 tablet 0  . sitaGLIPtin-metformin (JANUMET) 50-1000 MG tablet Take 1 tablet by mouth 2 (two) times daily with a meal.     . polyethylene glycol powder (GLYCOLAX/MIRALAX) powder 255 grams one bottle for colonoscopy prep 255 g 0   No current facility-administered medications for this visit.     Review of Systems Review of Systems  Constitutional: Negative.  Negative for unexpected weight change.  HENT: Negative.   Eyes: Negative.   Respiratory: Negative.   Cardiovascular: Negative.   Gastrointestinal: Positive for abdominal pain and diarrhea. Negative for constipation, nausea and vomiting.  Genitourinary: Negative.   Musculoskeletal: Negative.   Skin: Negative.   Allergic/Immunologic: Negative.   Neurological: Negative.   Hematological: Negative.   Psychiatric/Behavioral: Negative.     Blood pressure 106/62, pulse 85, resp. rate 10, height 5' 8"  (1.727 m), weight 153 lb (69.4 kg).  Physical Exam Physical Exam  Constitutional: He is oriented to person, place, and time. He appears well-developed and well-nourished.  HENT:  Mouth/Throat: Oropharynx is clear and moist.  Eyes: Conjunctivae are normal. No scleral icterus.  Neck: Neck supple.  Cardiovascular: Normal rate, regular rhythm and normal heart sounds.   Pulmonary/Chest: Effort normal and breath sounds normal.  Tender right lower rib  Abdominal: Soft. Normal appearance and bowel sounds are normal. There is tenderness in the right lower quadrant. No hernia.    Lymphadenopathy:    He has no cervical adenopathy.  Neurological: He is alert and oriented to person, place, and time.  Skin: Skin is warm and dry.  Psychiatric: His behavior is normal.    Data Reviewed Abdominal ultrasound for abdominal pain completed 04/21/2015 showed increased echogenicity suggestive of  fatty infiltration. No gallstones or wall thickening. Negative sonographic Murphy sign.  Chest CT obtained 09/16/2016 for reported worsening shortness of breath was negative.  Chest CT with contrast dated 01/19/2017 for cough and dyspnea was negative for pulmonary embolism or acute pneumonic process. These films were reviewed.The area of the right costal margin is not i  CBC dated 03/28/2017 showed hemoglobin essentially unchanged at 12.2 with an MCV of 75, white blood cell count of 7100,ately count of 360,000.normal differential.  Comprehensive metabolic palate showed modest elevation of the SGOT and SGPT, improved from 2017. Normal bilirubin. Creatinine 0.5 with an estimated GFR of 183. Elevated blood sugar. Normal electrolytes.hemoglobin A1c is 7.4. Improved from 8.1 earlier in the year.  Assessment    Postprandial abdominal pain and diarrhea.  Chronic, recurring pain along the inferior margin of the right costal margin, negative imaging by CT.    Plan    A CT scan of the abdomen and pelvis considering his persistent right lower quadrant pain is reasonable.   With no fever or chills reported, and no weight loss documented, even in spite of his travel to [place] I would defer testing for parasites at this time.  Considering his persistent diarrhea (without blood or mucus or weight loss, I have recommended a colonoscopy and random biopsies with polypectomy if indicated.     Colonoscopy with possible biopsy/polypectomy prn: Information regarding the procedure, including its potential risks and complications (including but not limited to perforation of the bowel, which may require emergency surgery to repair, and bleeding) was verbally given to the patient. Educational information regarding lower intestinal endoscopy was given to the patient. Written instructions for how to complete the bowel prep using Miralax were provided. The importance of drinking ample fluids to avoid dehydration as a  result of the prep emphasized.   HPI, Physical Exam, Assessment and Plan have been scribed under the direction and in the presence of Robert Bellow, MD. Karie Fetch, RN  I have completed the exam and reviewed the above documentation for accuracy and completeness.  I agree with the above.  Haematologist has been used and any errors in dictation or transcription are unintentional.  Hervey Ard, M.D., F.A.C.S.  Robert Bellow 07/14/2017, 5:56 PM  Patient has been scheduled for a CT abdomen/pelvis with contrast at [facility] for 07-21-17 at 1:30 pm (arrive 1:15 pm). Prep: NPO 4 hours prior and pick up prep kit. Patient verbalizes understanding.  This patient has been scheduled for a colonoscopy on 08-22-17 at [facility]. Miralax prescription has been sent in to the patient's pharmacy today. Colonoscopy instructions have been reviewed with the patient. This patient is aware to call the office if they have further questions.   The patient will be instructed to hold his Janumetthe day of the prep and procedure.  Dominga Ferry, CMA

## 2017-07-13 NOTE — Patient Instructions (Addendum)
The patient is aware to call back for any questions or concerns.  Colonoscopy, Adult A colonoscopy is an exam to look at the entire large intestine. During the exam, a lubricated, bendable tube is inserted into the anus and then passed into the rectum, colon, and other parts of the large intestine. A colonoscopy is often done as a part of normal colorectal screening or in response to certain symptoms, such as anemia, persistent diarrhea, abdominal pain, and blood in the stool. The exam can help screen for and diagnose medical problems, including:  Tumors.  Polyps.  Inflammation.  Areas of bleeding.  Tell a health care provider about:  Any allergies you have.  All medicines you are taking, including vitamins, herbs, eye drops, creams, and over-the-counter medicines.  Any problems you or family members have had with anesthetic medicines.  Any blood disorders you have.  Any surgeries you have had.  Any medical conditions you have.  Any problems you have had passing stool. What are the risks? Generally, this is a safe procedure. However, problems may occur, including:  Bleeding.  A tear in the intestine.  A reaction to medicines given during the exam.  Infection (rare).  What happens before the procedure? Eating and drinking restrictions Follow instructions from your health care provider about eating and drinking, which may include:  A few days before the procedure - follow a low-fiber diet. Avoid nuts, seeds, dried fruit, raw fruits, and vegetables.  1-3 days before the procedure - follow a clear liquid diet. Drink only clear liquids, such as clear broth or bouillon, black coffee or tea, clear juice, clear soft drinks or sports drinks, gelatin dessert, and popsicles. Avoid any liquids that contain red or purple dye.  On the day of the procedure - do not eat or drink anything during the 2 hours before the procedure, or within the time period that your health care provider  recommends.  Bowel prep If you were prescribed an oral bowel prep to clean out your colon:  Take it as told by your health care provider. Starting the day before your procedure, you will need to drink a large amount of medicated liquid. The liquid will cause you to have multiple loose stools until your stool is almost clear or light green.  If your skin or anus gets irritated from diarrhea, you may use these to relieve the irritation: ? Medicated wipes, such as adult wet wipes with aloe and vitamin E. ? A skin soothing-product like petroleum jelly.  If you vomit while drinking the bowel prep, take a break for up to 60 minutes and then begin the bowel prep again. If vomiting continues and you cannot take the bowel prep without vomiting, call your health care provider.  General instructions  Ask your health care provider about changing or stopping your regular medicines. This is especially important if you are taking diabetes medicines or blood thinners.  Plan to have someone take you home from the hospital or clinic. What happens during the procedure?  An IV tube may be inserted into one of your veins.  You will be given medicine to help you relax (sedative).  To reduce your risk of infection: ? Your health care team will wash or sanitize their hands. ? Your anal area will be washed with soap.  You will be asked to lie on your side with your knees bent.  Your health care provider will lubricate a long, thin, flexible tube. The tube will have a camera and  a light on the end.  The tube will be inserted into your anus.  The tube will be gently eased through your rectum and colon.  Air will be delivered into your colon to keep it open. You may feel some pressure or cramping.  The camera will be used to take images during the procedure.  A small tissue sample may be removed from your body to be examined under a microscope (biopsy). If any potential problems are found, the tissue  will be sent to a lab for testing.  If small polyps are found, your health care provider may remove them and have them checked for cancer cells.  The tube that was inserted into your anus will be slowly removed. The procedure may vary among health care providers and hospitals. What happens after the procedure?  Your blood pressure, heart rate, breathing rate, and blood oxygen level will be monitored until the medicines you were given have worn off.  Do not drive for 24 hours after the exam.  You may have a small amount of blood in your stool.  You may pass gas and have mild abdominal cramping or bloating due to the air that was used to inflate your colon during the exam.  It is up to you to get the results of your procedure. Ask your health care provider, or the department performing the procedure, when your results will be ready. This information is not intended to replace advice given to you by your health care provider. Make sure you discuss any questions you have with your health care provider. Document Released: 11/18/2000 Document Revised: 09/21/2016 Document Reviewed: 02/02/2016 Elsevier Interactive Patient Education  2018 [employer].

## 2017-07-14 DIAGNOSIS — R197 Diarrhea, unspecified: Secondary | ICD-10-CM | POA: Insufficient documentation

## 2017-07-14 DIAGNOSIS — R1031 Right lower quadrant pain: Secondary | ICD-10-CM | POA: Insufficient documentation

## 2017-07-17 ENCOUNTER — Telehealth: Payer: Self-pay | Admitting: *Deleted

## 2017-07-17 NOTE — Telephone Encounter (Signed)
yes

## 2017-07-17 NOTE — Telephone Encounter (Signed)
Patient called the office wanting to change date for his colonoscopy which was scheduled for 08-22-17.   This patient will be contacted once the October schedule is available.   Endoscopy notified of cancellation.   Patient will need to hold Janumet day of colonoscopy prep and procedure.   Also, wanting to see if he could have an upper endoscopy at time of colonoscopy.

## 2017-07-21 ENCOUNTER — Ambulatory Visit: Payer: 59

## 2017-07-24 ENCOUNTER — Ambulatory Visit
Admission: RE | Admit: 2017-07-24 | Discharge: 2017-07-24 | Disposition: A | Discharge status: Home / Self Care | Payer: 59 | Source: Ambulatory Visit | Attending: General Surgery | Admitting: General Surgery

## 2017-07-24 ENCOUNTER — Emergency Department
Admission: EM | Admit: 2017-07-24 | Discharge: 2017-07-24 | Disposition: A | Discharge status: Home / Self Care | Payer: 59 | Source: Home / Self Care | Attending: Student in an Organized Health Care Education/Training Program | Admitting: Student in an Organized Health Care Education/Training Program

## 2017-07-24 ENCOUNTER — Encounter: Payer: Self-pay | Admitting: *Deleted

## 2017-07-24 DIAGNOSIS — T508X5A Adverse effect of diagnostic agents, initial encounter: Secondary | ICD-10-CM | POA: Insufficient documentation

## 2017-07-24 DIAGNOSIS — R197 Diarrhea, unspecified: Secondary | ICD-10-CM | POA: Insufficient documentation

## 2017-07-24 DIAGNOSIS — R1031 Right lower quadrant pain: Secondary | ICD-10-CM | POA: Insufficient documentation

## 2017-07-24 DIAGNOSIS — R51 Headache: Secondary | ICD-10-CM | POA: Insufficient documentation

## 2017-07-24 DIAGNOSIS — R202 Paresthesia of skin: Secondary | ICD-10-CM | POA: Diagnosis not present

## 2017-07-24 DIAGNOSIS — E119 Type 2 diabetes mellitus without complications: Secondary | ICD-10-CM

## 2017-07-24 DIAGNOSIS — R070 Pain in throat: Secondary | ICD-10-CM | POA: Insufficient documentation

## 2017-07-24 DIAGNOSIS — R109 Unspecified abdominal pain: Secondary | ICD-10-CM | POA: Diagnosis not present

## 2017-07-24 DIAGNOSIS — Z7982 Long term (current) use of aspirin: Secondary | ICD-10-CM

## 2017-07-24 DIAGNOSIS — R21 Rash and other nonspecific skin eruption: Secondary | ICD-10-CM | POA: Diagnosis not present

## 2017-07-24 DIAGNOSIS — Z7984 Long term (current) use of oral hypoglycemic drugs: Secondary | ICD-10-CM

## 2017-07-24 DIAGNOSIS — L500 Allergic urticaria: Secondary | ICD-10-CM | POA: Insufficient documentation

## 2017-07-24 DIAGNOSIS — R0602 Shortness of breath: Secondary | ICD-10-CM | POA: Insufficient documentation

## 2017-07-24 DIAGNOSIS — R000 Tachycardia, unspecified: Secondary | ICD-10-CM | POA: Diagnosis not present

## 2017-07-24 DIAGNOSIS — Y92531 Health care provider office as the place of occurrence of the external cause: Secondary | ICD-10-CM | POA: Insufficient documentation

## 2017-07-24 DIAGNOSIS — R05 Cough: Secondary | ICD-10-CM | POA: Insufficient documentation

## 2017-07-24 DIAGNOSIS — T7840XA Allergy, unspecified, initial encounter: Secondary | ICD-10-CM | POA: Diagnosis not present

## 2017-07-24 DIAGNOSIS — T782XXA Anaphylactic shock, unspecified, initial encounter: Secondary | ICD-10-CM

## 2017-07-24 DIAGNOSIS — Z79899 Other long term (current) drug therapy: Secondary | ICD-10-CM

## 2017-07-24 DIAGNOSIS — R030 Elevated blood-pressure reading, without diagnosis of hypertension: Secondary | ICD-10-CM | POA: Diagnosis not present

## 2017-07-24 LAB — POCT I-STAT CREATININE: Creatinine, Ser: 0.6 mg/dL — ABNORMAL LOW (ref 0.61–1.24)

## 2017-07-24 MED ORDER — EPINEPHRINE 0.3 MG/0.3ML IJ SOAJ: 0.3000 mg | Freq: Once | INTRAMUSCULAR | 1 refills | Status: AC

## 2017-07-24 MED ORDER — METHYLPREDNISOLONE SODIUM SUCC 125 MG IJ SOLR: 125.0000 mg | INTRAMUSCULAR | Status: DC

## 2017-07-24 MED ORDER — IOPAMIDOL (ISOVUE-300) INJECTION 61%
100.0000 mL | Freq: Once | INTRAVENOUS | Status: AC | PRN
  Administered 2017-07-24: 100 mL via INTRAVENOUS

## 2017-07-24 MED ORDER — SODIUM CHLORIDE 0.9 % IV BOLUS (SEPSIS)
1000.0000 mL | Freq: Once | INTRAVENOUS | Status: AC
  Administered 2017-07-24: 1000 mL via INTRAVENOUS

## 2017-07-24 MED ORDER — PREDNISONE 10 MG PO TABS: 10.0000 mg | ORAL_TABLET | Freq: Every day | ORAL | 0 refills | Status: AC

## 2017-07-24 MED ORDER — DIPHENHYDRAMINE HCL 50 MG/ML IJ SOLN
50.0000 mg | Freq: Once | INTRAMUSCULAR | Status: DC
Start: 2017-07-24 — End: 2017-07-25

## 2017-07-24 MED ORDER — METHYLPREDNISOLONE SODIUM SUCC 125 MG IJ SOLR: 125.0000 mg | Freq: Once | INTRAMUSCULAR | Status: DC

## 2017-07-24 NOTE — Discharge Instructions (Signed)
COMPARISON:  None.   FINDINGS: Lower chest: No acute findings.   Hepatobiliary: Liver and gallbladder are unremarkable. No biliary ductal dilatation.   Pancreas: Negative.   Spleen: Negative.   Adrenals/Urinary Tract: Adrenal glands and kidneys are unremarkable. Ureters are decompressed. Bladder is grossly unremarkable.   Stomach/Bowel: Stomach, small bowel, appendix and colon are unremarkable.   Vascular/Lymphatic: Vascular structures are unremarkable. No pathologically enlarged lymph nodes.   Reproductive: Prostate is normal in size.   Other: No free fluid.  Mesenteries and peritoneum are unremarkable.   Musculoskeletal: No worrisome lytic or sclerotic lesions.   IMPRESSION: 1. Patient developed a moderate reaction to IV contrast, as detailed above, and should be premedicated with the standard 13 hour prednisone/benadryl prep prior to future IV contrast administration. 2. No findings to explain the patient's symptoms.

## 2017-07-24 NOTE — ED Provider Notes (Signed)
[facility] Emergency Department Provider Note    First MD Initiated Contact with Patient 07/24/17 1135     (approximate)  I have reviewed the triage vital signs and the nursing notes.   HISTORY  Chief Complaint Allergic Reaction    HPI Dalton Cole is a 41 y.o. male presents to the ER which she complaint of hives, shortness of breath and throat tightness after having a contrast CT of his abdomen. Patient states he's had CT scans in the past but has not had IV contrast as far as he is aware. No other history of allergies. EMS picked the patient up with outpatient imaging suite. He was given IV Solu-Medrol, Pepcid, Benadryl and IM epinephrine at 11:15. Since then he's had significant improvement in symptoms. States he feels some palpitations but that his shortness of breath, hives and itchiness have resolved. He is having an outpatient CT scan of his abdomen due to history of chronic diarrhea and intermittent abdominal pain. Denies any fevers or abdominal pain at this time.   Past Medical History:  Diagnosis Date  . Diabetes mellitus without complication ([place]) 6962  . Elevated liver enzymes   . Fatty liver   . GERD (gastroesophageal reflux disease)   . Hepatomegaly   . Hyperlipemia    Family History  Problem Relation Age of Onset  . Heart disease Mother   . Stroke Mother   . Diabetes Father   . Alcohol abuse Brother   . Colon cancer Neg Hx   . Liver disease Neg Hx    Past Surgical History:  Procedure Laterality Date  . COLONOSCOPY  2013?   bloody stool/[place]  . EAR CANALOPLASTY    . NASAL SEPTUM SURGERY     Patient Active Problem List   Diagnosis Date Noted  . Diarrhea 07/14/2017  . Right lower quadrant abdominal pain 07/14/2017  . SIRS (systemic inflammatory response syndrome) ([place]) 01/19/2017  . Diabetes mellitus type 2, uncontrolled ([place]) 06/12/2015  . Elevated liver function tests 06/05/2015  . Hepatic steatosis  06/05/2015  . Hyperlipidemia 06/02/2015  . Elevated liver enzymes 06/02/2015  . Elevated serum GGT level 06/02/2015  . GERD (gastroesophageal reflux disease) 06/02/2015      Prior to Admission medications   Medication Sig Start Date End Date Taking? Authorizing Provider  aspirin EC 81 MG tablet Take 81 mg by mouth daily.    [provider]  atorvastatin (LIPITOR) 10 MG tablet Take 10 mg by mouth 2 (two) times a week.    [provider]  EPINEPHrine 0.3 mg/0.3 mL IJ SOAJ injection Inject 0.3 mLs (0.3 mg total) into the muscle once. 07/24/17 07/24/17  Merlyn Lot, MD  lisinopril (ZESTRIL) 2.5 MG tablet Take 1 tablet (2.5 mg total) by mouth daily. 06/12/15   Roselee Nova, MD  Multiple Vitamin (MULTI-VITAMINS) TABS Take by mouth.    [provider]  pantoprazole (PROTONIX) 40 MG tablet Take 1 tablet (40 mg total) by mouth daily. 06/02/15   Roselee Nova, MD  polyethylene glycol powder [facility]) powder 255 grams one bottle for colonoscopy prep 07/13/17   Robert Bellow, MD  predniSONE (DELTASONE) 10 MG tablet Take 1 tablet (10 mg total) by mouth daily. Day 1-2: Take 50 mg  ( 5 pills) Day 3-4 : Take 40 mg (4pills) Day 5-6: Take 30 mg (3 pills) Day 7-8:  Take 20 mg (2 pills) Day 9:  Take 27m (1 pill) 07/24/17   RMerlyn Lot MD  sitaGLIPtin-metformin (JANUMET) 50-1000 MG tablet Take 1 tablet by mouth 2 (two) times daily with a meal.  01/02/17 01/02/18  [provider]    Allergies Contrast media [iodinated diagnostic agents]    Social History Social History  Substance Use Topics  . Smoking status: Never Smoker  . Smokeless tobacco: Never Used  . Alcohol use 0.0 oz/week     Comment: hx several drinks per week but quit 05/2015    Review of Systems Patient denies headaches, rhinorrhea, blurry vision, numbness, shortness of breath, chest pain, edema, cough, abdominal pain, nausea, vomiting, diarrhea, dysuria, fevers, rashes or  hallucinations unless otherwise stated above in HPI. ____________________________________________   PHYSICAL EXAM:  VITAL SIGNS: Vitals:   07/24/17 1230 07/24/17 1330  BP: 120/70 112/76  Pulse: (!) 116 (!) 113  Resp: 17 (!) 21  Temp:    SpO2: 96% 95%    Constitutional: Alert and oriented. Well appearing and in no acute distress. Eyes: Conjunctivae are normal.  Head: Atraumatic. Nose: No congestion/rhinnorhea. Mouth/Throat: Mucous membranes are moist.  No uvular edema Neck: No stridor. Painless ROM.  Cardiovascular: Normal rate, regular rhythm. Grossly normal heart sounds.  Good peripheral circulation. Respiratory: Normal respiratory effort.  No retractions. Lungs CTAB. Gastrointestinal: Soft and nontender. No distention. No abdominal bruits. No CVA tenderness. Genitourinary:  Musculoskeletal: No lower extremity tenderness nor edema.  No joint effusions. Neurologic:  Normal speech and language. No gross focal neurologic deficits are appreciated. No facial droop Skin:  Skin is warm, dry and intact. No rash noted. Psychiatric: Mood and affect are normal. Speech and behavior are normal.  ____________________________________________   LABS (all labs ordered are listed, but only abnormal results are displayed)  Results for orders placed or performed during the hospital encounter of 07/24/17 (from the past 24 hour(s))  I-STAT creatinine     Status: Abnormal   Collection Time: 07/24/17  9:37 AM  Result Value Ref Range   Creatinine, Ser 0.60 (L) 0.61 - 1.24 mg/dL   ____________________________________________  EKG My review and personal interpretation at Time: 11:26   Indication:  tachycardia  Rate: 120  Rhythm: sinus Axis: normal Other: normal intervals, no stemi ____________________________________________  RADIOLOGY   ____________________________________________   PROCEDURES  Procedure(s) performed:  Procedures    Critical Care performed:  no ____________________________________________   INITIAL IMPRESSION / ASSESSMENT AND PLAN / ED COURSE  Pertinent labs & imaging results that were available during my care of the patient were reviewed by me and considered in my medical decision making (see chart for details).  DDX: anaphylaxis, anaphylactoid, allergic reaction, dehydration  Dalton Cole is a 41 y.o. who presents to the ED with anaphylactic reaction to contrast dye. Patient received epinephrine H1 and H2 blockers as well as steroids. Patient was observed in the ER for 2 hours with no recurrence of symptoms. CT imaging showed no evidence of acute intra-abdominal process. Patient able to tolerate oral hydration and stable for outpatient follow-up.      ____________________________________________   FINAL CLINICAL IMPRESSION(S) / ED DIAGNOSES  Final diagnoses:  Anaphylaxis, initial encounter      NEW MEDICATIONS STARTED DURING THIS VISIT:  Discharge Medication List as of 07/24/2017  1:25 PM    START taking these medications   Details  EPINEPHrine 0.3 mg/0.3 mL IJ SOAJ injection Inject 0.3 mLs (0.3 mg total) into the muscle once., Starting Mon 07/24/2017, Print    predniSONE (DELTASONE) 10 MG tablet Take 1 tablet (10 mg total) by mouth daily. Day 1-2: Take  50 mg  ( 5 pills) Day 3-4 : Take 40 mg (4pills) Day 5-6: Take 30 mg (3 pills) Day 7-8:  Take 20 mg (2 pills) Day 9:  Take 39m (1 pill), Starting Mon 07/24/2017, Print         Note:  This document was prepared using Dragon voice recognition software and may include unintentional dictation errors.    RMerlyn Lot MD 07/24/17 2015

## 2017-07-24 NOTE — ED Notes (Signed)
Pt resting in bed eating and drinking, resp even and unlabored

## 2017-07-24 NOTE — ED Triage Notes (Signed)
Pt arrives via EMS from outpt CT scan, pt was scheduled for CT of abd and received contrast dye, began to be short of breathe, hives and itching, facility gave 50 IV benadryl and 125 mg Solu-medrol at 9:50 am, EMS gave 20 of IV pepcid and 0.5 mg IM epi at 11:15, upon arrival most hives are gone, some of temple noticed, states feeling better despite some tightness in his throat and tingling in his lips, resp even and unlabored

## 2017-07-25 DIAGNOSIS — E119 Type 2 diabetes mellitus without complications: Secondary | ICD-10-CM | POA: Diagnosis not present

## 2017-07-25 DIAGNOSIS — G4733 Obstructive sleep apnea (adult) (pediatric): Secondary | ICD-10-CM | POA: Diagnosis not present

## 2017-07-25 DIAGNOSIS — Z0000 Encounter for general adult medical examination without abnormal findings: Secondary | ICD-10-CM | POA: Diagnosis not present

## 2017-07-25 DIAGNOSIS — K219 Gastro-esophageal reflux disease without esophagitis: Secondary | ICD-10-CM | POA: Diagnosis not present

## 2017-07-25 DIAGNOSIS — E785 Hyperlipidemia, unspecified: Secondary | ICD-10-CM | POA: Diagnosis not present

## 2017-07-26 ENCOUNTER — Telehealth: Payer: Self-pay | Admitting: *Deleted

## 2017-07-26 ENCOUNTER — Encounter: Payer: Self-pay | Admitting: *Deleted

## 2017-07-26 NOTE — Telephone Encounter (Signed)
Patient was contacted today and was notified that the October schedule is out.   This patient has been scheduled for an upper and lower endoscopy on at [facility]. This patient is aware to call the office if they have further questions.

## 2017-08-04 DIAGNOSIS — I10 Essential (primary) hypertension: Secondary | ICD-10-CM | POA: Diagnosis not present

## 2017-08-04 DIAGNOSIS — D649 Anemia, unspecified: Secondary | ICD-10-CM | POA: Diagnosis not present

## 2017-08-04 DIAGNOSIS — N529 Male erectile dysfunction, unspecified: Secondary | ICD-10-CM | POA: Diagnosis not present

## 2017-08-04 DIAGNOSIS — E119 Type 2 diabetes mellitus without complications: Secondary | ICD-10-CM | POA: Diagnosis not present

## 2017-08-04 DIAGNOSIS — R7989 Other specified abnormal findings of blood chemistry: Secondary | ICD-10-CM | POA: Diagnosis not present

## 2017-08-16 ENCOUNTER — Other Ambulatory Visit: Payer: Self-pay | Admitting: Internal Medicine

## 2017-08-16 DIAGNOSIS — N529 Male erectile dysfunction, unspecified: Secondary | ICD-10-CM

## 2017-08-16 DIAGNOSIS — E119 Type 2 diabetes mellitus without complications: Secondary | ICD-10-CM

## 2017-08-16 DIAGNOSIS — E538 Deficiency of other specified B group vitamins: Secondary | ICD-10-CM

## 2017-08-16 DIAGNOSIS — I10 Essential (primary) hypertension: Secondary | ICD-10-CM

## 2017-08-16 DIAGNOSIS — D649 Anemia, unspecified: Secondary | ICD-10-CM

## 2017-08-22 ENCOUNTER — Ambulatory Visit: Admit: 2017-08-22 | Payer: 59 | Admitting: General Surgery

## 2017-08-22 SURGERY — COLONOSCOPY WITH PROPOFOL
Anesthesia: General

## 2017-08-23 ENCOUNTER — Other Ambulatory Visit: Payer: Self-pay | Admitting: Internal Medicine

## 2017-08-23 ENCOUNTER — Ambulatory Visit
Admission: RE | Admit: 2017-08-23 | Discharge: 2017-08-23 | Disposition: A | Discharge status: Home / Self Care | Payer: 59 | Source: Ambulatory Visit | Attending: Internal Medicine | Admitting: Internal Medicine

## 2017-08-23 DIAGNOSIS — R7989 Other specified abnormal findings of blood chemistry: Secondary | ICD-10-CM | POA: Insufficient documentation

## 2017-08-23 DIAGNOSIS — I10 Essential (primary) hypertension: Secondary | ICD-10-CM | POA: Insufficient documentation

## 2017-08-23 DIAGNOSIS — D649 Anemia, unspecified: Secondary | ICD-10-CM | POA: Insufficient documentation

## 2017-08-23 DIAGNOSIS — E538 Deficiency of other specified B group vitamins: Secondary | ICD-10-CM

## 2017-08-23 DIAGNOSIS — N529 Male erectile dysfunction, unspecified: Secondary | ICD-10-CM | POA: Diagnosis not present

## 2017-08-23 DIAGNOSIS — E119 Type 2 diabetes mellitus without complications: Secondary | ICD-10-CM | POA: Insufficient documentation

## 2017-08-23 DIAGNOSIS — R52 Pain, unspecified: Secondary | ICD-10-CM

## 2017-08-23 DIAGNOSIS — Z0000 Encounter for general adult medical examination without abnormal findings: Secondary | ICD-10-CM

## 2017-08-23 DIAGNOSIS — K740 Hepatic fibrosis: Secondary | ICD-10-CM | POA: Diagnosis not present

## 2017-09-06 ENCOUNTER — Other Ambulatory Visit: Payer: Self-pay | Admitting: General Surgery

## 2017-09-06 DIAGNOSIS — R197 Diarrhea, unspecified: Secondary | ICD-10-CM

## 2017-09-06 NOTE — Progress Notes (Signed)
Patient undergoing evaluation for right upper quadrant pain below the costal margin and diarrhea. Negative ultrasound. CT scan of 07/24/2017 reviewed and unremarkable.  Upper and lower endoscopy plan to evaluate for a source of his abdominal pain and diarrhea.

## 2017-09-07 ENCOUNTER — Encounter: Payer: Self-pay | Admitting: *Deleted

## 2017-09-11 ENCOUNTER — Telehealth: Payer: Self-pay | Admitting: *Deleted

## 2017-09-11 NOTE — Telephone Encounter (Signed)
My Chart message sent to patient last week with no response.   Message left for patient on his cell phone to call the office back or respond to message via My Chart.   Patient is scheduled for an upper and lower endoscopy on 09-13-17 and I just want to follow up with him.

## 2017-09-12 ENCOUNTER — Encounter: Payer: Self-pay | Admitting: *Deleted

## 2017-09-12 NOTE — Telephone Encounter (Signed)
Spoke with the patient and reviewed colonoscopy instructions. He has had no medication changes.

## 2017-09-13 ENCOUNTER — Encounter
Admission: RE | Disposition: A | Discharge status: Home / Self Care | Payer: Self-pay | Source: Ambulatory Visit | Attending: General Surgery

## 2017-09-13 ENCOUNTER — Ambulatory Visit: Payer: 59 | Admitting: Anesthesiology

## 2017-09-13 ENCOUNTER — Ambulatory Visit
Admission: RE | Admit: 2017-09-13 | Discharge: 2017-09-13 | Disposition: A | Discharge status: Home / Self Care | Payer: 59 | Source: Ambulatory Visit | Attending: General Surgery | Admitting: General Surgery

## 2017-09-13 ENCOUNTER — Other Ambulatory Visit: Payer: Self-pay

## 2017-09-13 DIAGNOSIS — Z91041 Radiographic dye allergy status: Secondary | ICD-10-CM | POA: Diagnosis not present

## 2017-09-13 DIAGNOSIS — R160 Hepatomegaly, not elsewhere classified: Secondary | ICD-10-CM | POA: Insufficient documentation

## 2017-09-13 DIAGNOSIS — R011 Cardiac murmur, unspecified: Secondary | ICD-10-CM | POA: Insufficient documentation

## 2017-09-13 DIAGNOSIS — K51919 Ulcerative colitis, unspecified with unspecified complications: Secondary | ICD-10-CM | POA: Diagnosis not present

## 2017-09-13 DIAGNOSIS — R197 Diarrhea, unspecified: Secondary | ICD-10-CM

## 2017-09-13 DIAGNOSIS — K760 Fatty (change of) liver, not elsewhere classified: Secondary | ICD-10-CM | POA: Insufficient documentation

## 2017-09-13 DIAGNOSIS — K5190 Ulcerative colitis, unspecified, without complications: Secondary | ICD-10-CM | POA: Diagnosis not present

## 2017-09-13 DIAGNOSIS — Z7984 Long term (current) use of oral hypoglycemic drugs: Secondary | ICD-10-CM | POA: Insufficient documentation

## 2017-09-13 DIAGNOSIS — Z79899 Other long term (current) drug therapy: Secondary | ICD-10-CM | POA: Insufficient documentation

## 2017-09-13 DIAGNOSIS — K5180 Other ulcerative colitis without complications: Secondary | ICD-10-CM

## 2017-09-13 DIAGNOSIS — Z7982 Long term (current) use of aspirin: Secondary | ICD-10-CM | POA: Diagnosis not present

## 2017-09-13 DIAGNOSIS — K219 Gastro-esophageal reflux disease without esophagitis: Secondary | ICD-10-CM | POA: Diagnosis not present

## 2017-09-13 DIAGNOSIS — E119 Type 2 diabetes mellitus without complications: Secondary | ICD-10-CM | POA: Insufficient documentation

## 2017-09-13 DIAGNOSIS — E785 Hyperlipidemia, unspecified: Secondary | ICD-10-CM | POA: Insufficient documentation

## 2017-09-13 DIAGNOSIS — K529 Noninfective gastroenteritis and colitis, unspecified: Secondary | ICD-10-CM | POA: Diagnosis not present

## 2017-09-13 HISTORY — PX: COLONOSCOPY WITH PROPOFOL: SHX5780

## 2017-09-13 HISTORY — PX: ESOPHAGOGASTRODUODENOSCOPY (EGD) WITH PROPOFOL: SHX5813

## 2017-09-13 HISTORY — DX: Cardiac murmur, unspecified: R01.1

## 2017-09-13 LAB — CBC WITH DIFFERENTIAL/PLATELET
Basophils Absolute: 0 10*3/uL (ref 0–0.1)
Basophils Relative: 1 %
EOS PCT: 6 %
Eosinophils Absolute: 0.3 10*3/uL (ref 0–0.7)
HEMATOCRIT: 33.8 % — AB (ref 40.0–52.0)
Hemoglobin: 11.1 g/dL — ABNORMAL LOW (ref 13.0–18.0)
LYMPHS PCT: 25 %
Lymphs Abs: 1.5 10*3/uL (ref 1.0–3.6)
MCH: 22.9 pg — AB (ref 26.0–34.0)
MCHC: 32.8 g/dL (ref 32.0–36.0)
MCV: 69.9 fL — AB (ref 80.0–100.0)
MONO ABS: 0.5 10*3/uL (ref 0.2–1.0)
MONOS PCT: 8 %
NEUTROS ABS: 3.7 10*3/uL (ref 1.4–6.5)
Neutrophils Relative %: 60 %
Platelets: 386 10*3/uL (ref 150–440)
RBC: 4.84 MIL/uL (ref 4.40–5.90)
RDW: 15.4 % — AB (ref 11.5–14.5)
WBC: 6 10*3/uL (ref 3.8–10.6)

## 2017-09-13 LAB — COMPREHENSIVE METABOLIC PANEL
ALT: 37 U/L (ref 17–63)
ANION GAP: 7 (ref 5–15)
AST: 28 U/L (ref 15–41)
Albumin: 3.5 g/dL (ref 3.5–5.0)
Alkaline Phosphatase: 160 U/L — ABNORMAL HIGH (ref 38–126)
BILIRUBIN TOTAL: 0.5 mg/dL (ref 0.3–1.2)
BUN: 8 mg/dL (ref 6–20)
CO2: 26 mmol/L (ref 22–32)
Calcium: 8.5 mg/dL — ABNORMAL LOW (ref 8.9–10.3)
Chloride: 107 mmol/L (ref 101–111)
Creatinine, Ser: 0.89 mg/dL (ref 0.61–1.24)
Glucose, Bld: 139 mg/dL — ABNORMAL HIGH (ref 65–99)
POTASSIUM: 4.4 mmol/L (ref 3.5–5.1)
Sodium: 140 mmol/L (ref 135–145)
TOTAL PROTEIN: 7.1 g/dL (ref 6.5–8.1)

## 2017-09-13 LAB — GLUCOSE, CAPILLARY: Glucose-Capillary: 142 mg/dL — ABNORMAL HIGH (ref 65–99)

## 2017-09-13 LAB — C-REACTIVE PROTEIN

## 2017-09-13 LAB — SEDIMENTATION RATE: SED RATE: 29 mm/h — AB (ref 0–15)

## 2017-09-13 SURGERY — COLONOSCOPY WITH PROPOFOL
Anesthesia: General

## 2017-09-13 MED ORDER — PHENYLEPHRINE HCL 10 MG/ML IJ SOLN
INTRAMUSCULAR | Status: DC | PRN
  Administered 2017-09-13: 50 ug via INTRAVENOUS
  Administered 2017-09-13: 100 ug via INTRAVENOUS

## 2017-09-13 MED ORDER — SODIUM CHLORIDE 0.9 % IV SOLN
INTRAVENOUS | Status: DC
  Administered 2017-09-13: 09:00:00 via INTRAVENOUS

## 2017-09-13 MED ORDER — FENTANYL CITRATE (PF) 100 MCG/2ML IJ SOLN
INTRAMUSCULAR | Status: DC | PRN
  Administered 2017-09-13: 50 ug via INTRAVENOUS

## 2017-09-13 MED ORDER — PROPOFOL 10 MG/ML IV BOLUS
INTRAVENOUS | Status: DC | PRN
  Administered 2017-09-13: 90 mg via INTRAVENOUS

## 2017-09-13 MED ORDER — GLYCOPYRROLATE 0.2 MG/ML IV SOSY
PREFILLED_SYRINGE | INTRAVENOUS | Status: DC | PRN
  Administered 2017-09-13: .2 mg via INTRAVENOUS

## 2017-09-13 MED ORDER — MIDAZOLAM HCL 2 MG/2ML IJ SOLN
INTRAMUSCULAR | Status: AC
  Filled 2017-09-13: qty 2

## 2017-09-13 MED ORDER — MIDAZOLAM HCL 2 MG/2ML IJ SOLN
INTRAMUSCULAR | Status: DC | PRN
  Administered 2017-09-13: 2 mg via INTRAVENOUS

## 2017-09-13 MED ORDER — FENTANYL CITRATE (PF) 100 MCG/2ML IJ SOLN
INTRAMUSCULAR | Status: AC
  Filled 2017-09-13: qty 2

## 2017-09-13 MED ORDER — PROPOFOL 500 MG/50ML IV EMUL
INTRAVENOUS | Status: DC | PRN
  Administered 2017-09-13: 120 ug/kg/min via INTRAVENOUS

## 2017-09-13 MED ORDER — PROPOFOL 10 MG/ML IV BOLUS
INTRAVENOUS | Status: AC
  Filled 2017-09-13: qty 20

## 2017-09-13 NOTE — Anesthesia Post-op Follow-up Note (Signed)
Anesthesia QCDR form completed.        

## 2017-09-13 NOTE — Op Note (Signed)
[facility] Gastroenterology Patient Name: Dalton Cole Procedure Date: 09/13/2017 8:59 AM MRN: [id number] Account #: [id number] Date of Birth: 08-03-1976 Admit Type: Outpatient Age: 41 Room: [facility] ENDO ROOM 1 Gender: Male Note Status: Finalized Procedure:            Upper GI endoscopy Indications:          Diarrhea Providers:            Robert Bellow, MD Referring MD:         Tracie Harrier, MD (Referring MD) Medicines:            Monitored Anesthesia Care Complications:        No immediate complications. Procedure:            Pre-Anesthesia Assessment:                       - Prior to the procedure, a History and Physical was                        performed, and patient medications, allergies and                        sensitivities were reviewed. The patient's tolerance of                        previous anesthesia was reviewed.                       - The risks and benefits of the procedure and the                        sedation options and risks were discussed with the                        patient. All questions were answered and informed                        consent was obtained.                       After obtaining informed consent, the endoscope was                        passed under direct vision. Throughout the procedure,                        the patient's blood pressure, pulse, and oxygen                        saturations were monitored continuously. The Endoscope                        was introduced through the mouth, and advanced to the                        third part of duodenum. The upper GI endoscopy was                        accomplished without difficulty. The patient tolerated  the procedure well. Findings:      The esophagus was normal.      The stomach was normal.      The examined duodenum was normal. Impression:           - Normal esophagus.                       - Normal stomach.                       - Normal examined duodenum.                       - No specimens collected. Recommendation:       - Perform a colonoscopy today. Procedure Code(s):    --- Professional ---                       [phone], Esophagogastroduodenoscopy, flexible, transoral;                        diagnostic, including collection of specimen(s) by                        brushing or washing, when performed (separate procedure) Diagnosis Code(s):    --- Professional ---                       R19.7, Diarrhea, unspecified CPT copyright 2016 American Medical Association. All rights reserved. The codes documented in this report are preliminary and upon coder review may  be revised to meet current compliance requirements. Robert Bellow, MD 09/13/2017 9:09:53 AM This report has been signed electronically. Number of Addenda: 0 Note Initiated On: 09/13/2017 8:59 AM      [facility]

## 2017-09-13 NOTE — Progress Notes (Signed)
Patient underwent upper and lower endoscopy today to evaluate for abdominal pain and postprandial diarrhea. Upper endoscopy is normal. Colonoscopy showed severe colitis beginning at the hepatic flexure and extending to the sigmoid colon.  Gaylyn Cheers, M.D. from gastroenterology came into the procedure room during the procedure for consultation.  Biopsies of the right colon, transverse colon, descending colon and sigmoid colon were obtained.  Baseline labs for inflammatory bowel disease were to be obtained.  We'll arrange for outpatient follow-up with Dr. Vira Agar in the near future.

## 2017-09-13 NOTE — Transfer of Care (Signed)
Immediate Anesthesia Transfer of Care Note  Patient: Dalton Cole  Procedure(s) Performed: COLONOSCOPY WITH PROPOFOL (N/A ) ESOPHAGOGASTRODUODENOSCOPY (EGD) WITH PROPOFOL (N/A )  Patient Location: PACU and Endoscopy Unit  Anesthesia Type:General  Level of Consciousness: drowsy and patient cooperative  Airway & Oxygen Therapy: Patient Spontanous Breathing and Patient connected to nasal cannula oxygen  Post-op Assessment: Report given to RN and Post -op Vital signs reviewed and stable  Post vital signs: Reviewed and stable  Last Vitals:  Vitals:   09/13/17 0933 09/13/17 0934  BP: (!) 82/54 (!) 82/54  Pulse: 95 93  Resp: 20 12  Temp: 36.9 C   SpO2: 100% 100%    Last Pain:  Vitals:   09/13/17 0837  TempSrc: Tympanic         Complications: No apparent anesthesia complications

## 2017-09-13 NOTE — Op Note (Signed)
[facility] Gastroenterology Patient Name: Dalton Cole Procedure Date: 09/13/2017 8:59 AM MRN: [id number] Account #: [id number] Date of Birth: September 28, 1976 Admit Type: Outpatient Age: 41 Room: [facility] ENDO ROOM 1 Gender: Male Note Status: Finalized Procedure:            Colonoscopy Indications:          Clinically significant diarrhea of unexplained origin Providers:            Robert Bellow, MD Referring MD:         Tracie Harrier, MD (Referring MD) Medicines:            Monitored Anesthesia Care Complications:        No immediate complications. Procedure:            Pre-Anesthesia Assessment:                       - Prior to the procedure, a History and Physical was                        performed, and patient medications, allergies and                        sensitivities were reviewed. The patient's tolerance of                        previous anesthesia was reviewed.                       - The risks and benefits of the procedure and the                        sedation options and risks were discussed with the                        patient. All questions were answered and informed                        consent was obtained.                       After obtaining informed consent, the colonoscope was                        passed under direct vision. Throughout the procedure,                        the patient's blood pressure, pulse, and oxygen                        saturations were monitored continuously. The                        Colonoscope was introduced through the anus and                        advanced to the the cecum, identified by appendiceal                        orifice and ileocecal valve. The colonoscopy was  performed without difficulty. The patient tolerated the                        procedure well. The quality of the bowel preparation                        was excellent. Findings:  Inflammation characterized by congestion (edema), erythema, friability,       granularity and loss of vascularity was found in a continuous and       circumferential pattern from the sigmoid colon to the hepatic flexure.       The rectum and the ascending colon were spared. This was severe, and       when compared to previous examinations, the findings are new. Biopsies       completed of the right colon, transverse colon, descending colon and       sigmoid colon with a cold forceps for histology.      The retroflexed view of the distal rectum and anal verge was normal and       showed no anal or rectal abnormalities. Impression:           - Ulcerative colitis. Inflammation was found from the                        sigmoid colon to the hepatic flexure. This was severe.                        The findings are new compared to previous examinations.                        Biopsied.                       - The distal rectum and anal verge are normal on                        retroflexion view. Recommendation:       - Telephone endoscopist for pathology results in 1 week. Procedure Code(s):    --- Professional ---                       [phone], Colonoscopy, flexible; with biopsy, single or                        multiple Diagnosis Code(s):    --- Professional ---                       K51.90, Ulcerative colitis, unspecified, without                        complications                       R19.7, Diarrhea, unspecified CPT copyright 2016 American Medical Association. All rights reserved. The codes documented in this report are preliminary and upon coder review may  be revised to meet current compliance requirements. Robert Bellow, MD 09/13/2017 9:31:45 AM This report has been signed electronically. Number of Addenda: 0 Note Initiated On: 09/13/2017 8:59 AM Scope Withdrawal Time: 0 hours 9 minutes 3 seconds  Total Procedure Duration: 0 hours 13 minutes 44 seconds  [facility]

## 2017-09-13 NOTE — Anesthesia Postprocedure Evaluation (Signed)
Anesthesia Post Note  Patient: Dalton Cole  Procedure(s) Performed: COLONOSCOPY WITH PROPOFOL (N/A ) ESOPHAGOGASTRODUODENOSCOPY (EGD) WITH PROPOFOL (N/A )  Patient location during evaluation: Endoscopy Anesthesia Type: General Level of consciousness: awake and alert Pain management: pain level controlled Vital Signs Assessment: post-procedure vital signs reviewed and stable Respiratory status: spontaneous breathing and respiratory function stable Cardiovascular status: stable Anesthetic complications: no     Last Vitals:  Vitals:   09/13/17 0940 09/13/17 0947  BP: 98/62 101/72  Pulse: 92 93  Resp: 15 14  Temp:    SpO2: 100% 100%    Last Pain:  Vitals:   09/13/17 0933  TempSrc: Tympanic                 KEPHART,WILLIAM K

## 2017-09-13 NOTE — H&P (Signed)
Dalton Cole [id number] 1976-01-21     HPI: Healthy 41 y/o male with pronounced post prandial diarrhea. No dietary triggers (dry toast, fried foods) produce same symptoms, although more pronounced with fried or spicy foods.  Abdominal ultrasound negative. Negative chest/ abdomen/ pelvic CT.  For upper and lower endoscopy.   Prescriptions Prior to Admission  Medication Sig Dispense Refill Last Dose  . cyanocobalamin 1000 MCG tablet Take 1,000 mcg by mouth daily.   09/12/2017 at Unknown time  . lisinopril (ZESTRIL) 2.5 MG tablet Take 1 tablet (2.5 mg total) by mouth daily. 90 tablet 1 09/11/2017 at Unknown time  . Multiple Vitamin (MULTI-VITAMINS) TABS Take by mouth.   09/12/2017 at Unknown time  . pantoprazole (PROTONIX) 40 MG tablet Take 1 tablet (40 mg total) by mouth daily. 90 tablet 0 09/12/2017 at Unknown time  . aspirin EC 81 MG tablet Take 81 mg by mouth daily.   09/11/2017  . atorvastatin (LIPITOR) 10 MG tablet Take 10 mg by mouth 2 (two) times a week.   Taking  . polyethylene glycol powder (GLYCOLAX/MIRALAX) powder 255 grams one bottle for colonoscopy prep 255 g 0   . predniSONE (DELTASONE) 10 MG tablet Take 1 tablet (10 mg total) by mouth daily. Day 1-2: Take 50 mg  ( 5 pills) Day 3-4 : Take 40 mg (4pills) Day 5-6: Take 30 mg (3 pills) Day 7-8:  Take 20 mg (2 pills) Day 9:  Take 22m (1 pill) (Patient not taking: Reported on 09/13/2017) 29 tablet 0 Completed Course at Unknown time  . sitaGLIPtin-metformin (JANUMET) 50-1000 MG tablet Take 1 tablet by mouth 2 (two) times daily with a meal.    09/11/2017   Allergies  Allergen Reactions  . Contrast Media [Iodinated Diagnostic Agents] Hives, Swelling and Cough    Pt c/o hives, cough, sneezing, and mild swelling/itching of throat after contrast media injection.     Past Medical History:  Diagnosis Date  . Diabetes mellitus without complication ([place] [address] . Elevated liver enzymes   . Fatty liver   . GERD (gastroesophageal  reflux disease)   . Heart murmur   . Hepatomegaly   . Hyperlipemia    Past Surgical History:  Procedure Laterality Date  . COLONOSCOPY  2013?   bloody stool/[place]  . EAR CANALOPLASTY    . IR FIBRIN GLUE REPAIR ANAL FISTULA    . NASAL SEPTUM SURGERY     Social History   Social History  . Marital status: Married    Spouse name: N/A  . Number of children: 0  . Years of education: N/A   Occupational History  . [employer] sales    Social History Main Topics  . Smoking status: Never Smoker  . Smokeless tobacco: Never Used  . Alcohol use 0.0 oz/week     Comment: none in 2 weeks  . Drug use: No  . Sexual activity: Yes   Other Topics Concern  . 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     PE: HEENT: Negative. Lungs: Clear. Cardio: RR..Marland Kitchen Assessment/Plan:  Proceed with planned endoscopy.  BRobert Bellow10/09/2017

## 2017-09-13 NOTE — Anesthesia Preprocedure Evaluation (Signed)
Anesthesia Evaluation  Patient identified by MRN, date of birth, ID band Patient awake    Reviewed: Allergy & Precautions, NPO status , Patient's Chart, lab work & pertinent test results  History of Anesthesia Complications Negative for: history of anesthetic complications  Airway Mallampati: II       Dental   Pulmonary neg sleep apnea, neg COPD,           Cardiovascular (-) hypertension(-) Past MI and (-) CHF (-) dysrhythmias + Valvular Problems/Murmurs (murmur, no tx)      Neuro/Psych neg Seizures    GI/Hepatic Neg liver ROS, GERD  Medicated and Controlled,  Endo/Other  diabetes, Type 2, Oral Hypoglycemic Agents  Renal/GU negative Renal ROS     Musculoskeletal   Abdominal   Peds  Hematology   Anesthesia Other Findings   Reproductive/Obstetrics                           Anesthesia Physical Anesthesia Plan  ASA: III  Anesthesia Plan: General   Post-op Pain Management:    Induction: Intravenous  PONV Risk Score and Plan: Propofol infusion  Airway Management Planned:   Additional Equipment:   Intra-op Plan:   Post-operative Plan:   Informed Consent: I have reviewed the patients History and Physical, chart, labs and discussed the procedure including the risks, benefits and alternatives for the proposed anesthesia with the patient or authorized representative who has indicated his/her understanding and acceptance.     Plan Discussed with:   Anesthesia Plan Comments:         Anesthesia Quick Evaluation

## 2017-09-14 ENCOUNTER — Encounter: Payer: Self-pay | Admitting: General Surgery

## 2017-09-16 LAB — SURGICAL PATHOLOGY

## 2017-10-17 DIAGNOSIS — K5100 Ulcerative (chronic) pancolitis without complications: Secondary | ICD-10-CM | POA: Diagnosis not present

## 2017-10-23 DIAGNOSIS — H524 Presbyopia: Secondary | ICD-10-CM | POA: Diagnosis not present

## 2017-11-29 DIAGNOSIS — R972 Elevated prostate specific antigen [PSA]: Secondary | ICD-10-CM | POA: Diagnosis not present

## 2017-11-29 DIAGNOSIS — E785 Hyperlipidemia, unspecified: Secondary | ICD-10-CM | POA: Diagnosis not present

## 2017-11-29 DIAGNOSIS — D649 Anemia, unspecified: Secondary | ICD-10-CM | POA: Diagnosis not present

## 2017-11-29 DIAGNOSIS — R7989 Other specified abnormal findings of blood chemistry: Secondary | ICD-10-CM | POA: Diagnosis not present

## 2017-11-29 DIAGNOSIS — Z0000 Encounter for general adult medical examination without abnormal findings: Secondary | ICD-10-CM | POA: Diagnosis not present

## 2017-11-29 DIAGNOSIS — E119 Type 2 diabetes mellitus without complications: Secondary | ICD-10-CM | POA: Diagnosis not present

## 2017-12-12 DIAGNOSIS — K5190 Ulcerative colitis, unspecified, without complications: Secondary | ICD-10-CM | POA: Insufficient documentation

## 2017-12-12 DIAGNOSIS — G4733 Obstructive sleep apnea (adult) (pediatric): Secondary | ICD-10-CM | POA: Diagnosis not present

## 2017-12-12 DIAGNOSIS — E119 Type 2 diabetes mellitus without complications: Secondary | ICD-10-CM | POA: Diagnosis not present

## 2017-12-12 DIAGNOSIS — D649 Anemia, unspecified: Secondary | ICD-10-CM | POA: Diagnosis not present

## 2017-12-27 IMAGING — US US [PERSON_NAME]
1 series · 13 of 16 positions shown · non-contrast
Comparison: None.

CLINICAL DATA: Diabetes, hypertension, anemia

EXAM:
ULTRASOUND HEPATIC ELASTOGRAPHY
TECHNIQUE: Ultrasound elastography evaluation of the liver was performed. A
region of interest was placed in the right lobe of the liver.
Following application of a compressive sonographic pulse, shear
waves were detected in the adjacent hepatic tissue and the shear
wave velocity was calculated. Multiple assessments were performed at
the selected site. Median shear wave velocity is correlated to a
Metavir fibrosis score.

[Series 1: us (person_name) · 0.22mm/px · 13 of 16 slices shown]
[im 1/16]
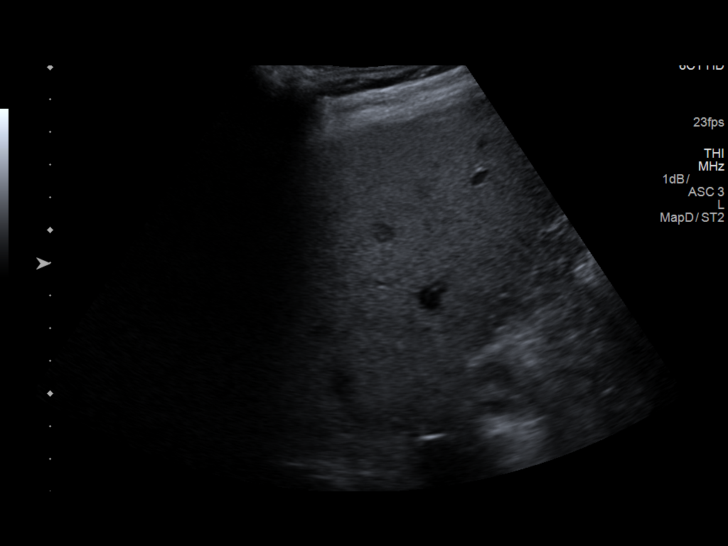
[im 2/16]
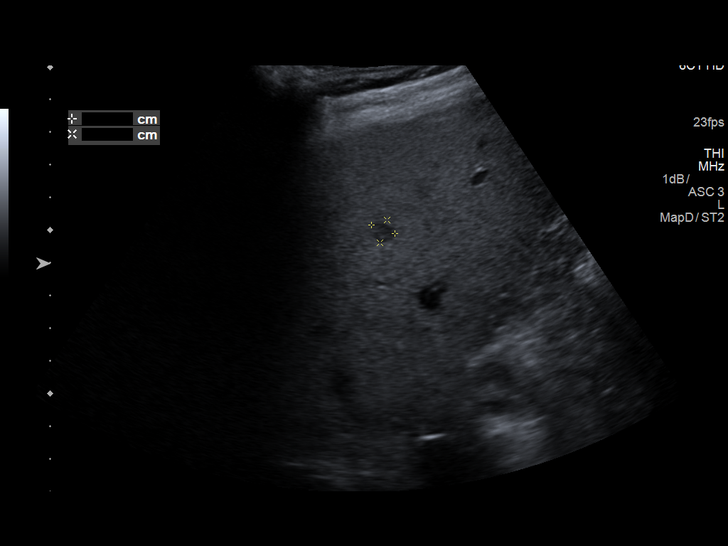
[im 4/16]
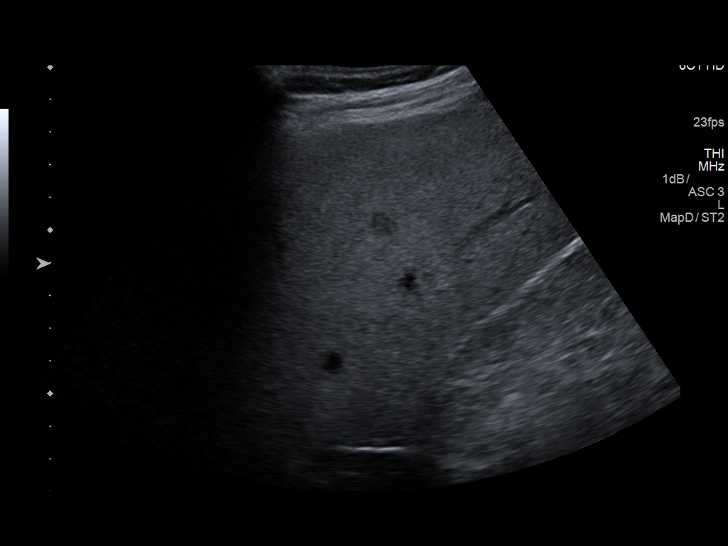
[im 5/16]
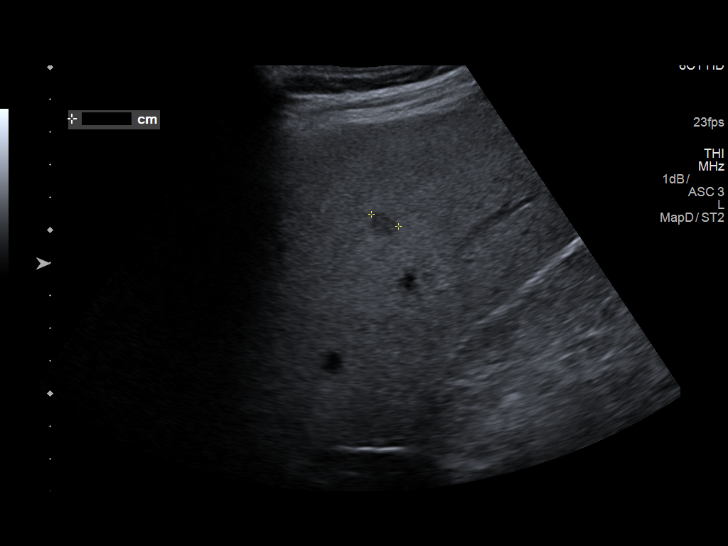
[im 6/16]
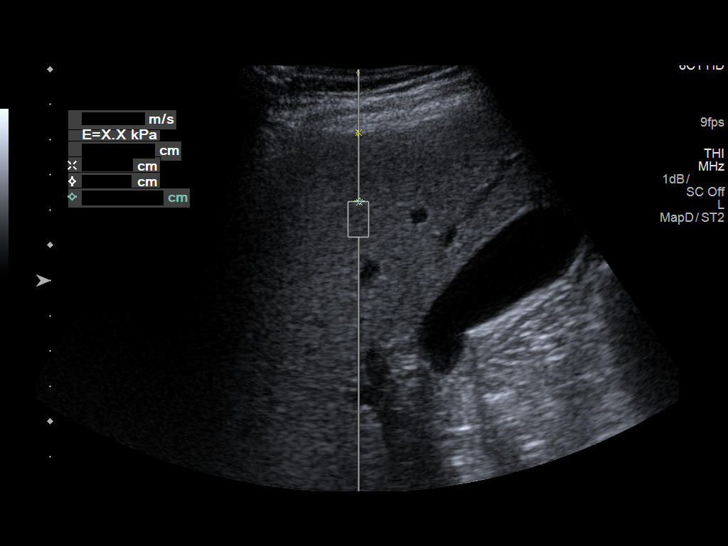
[im 7/16]
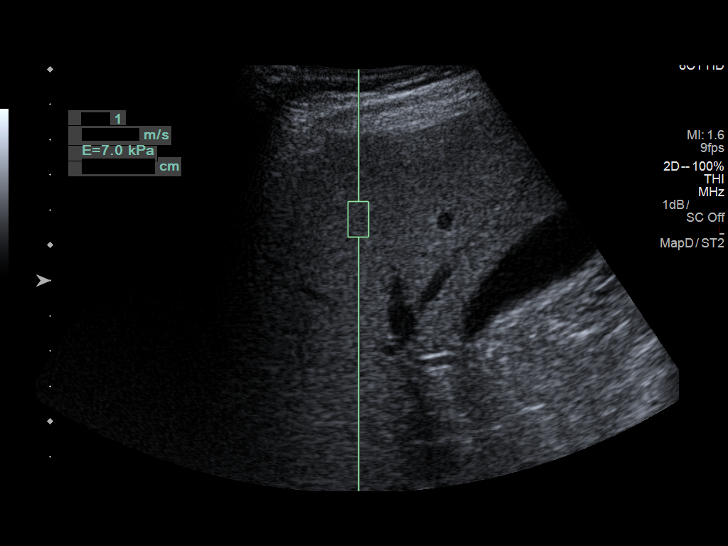
[im 9/16]
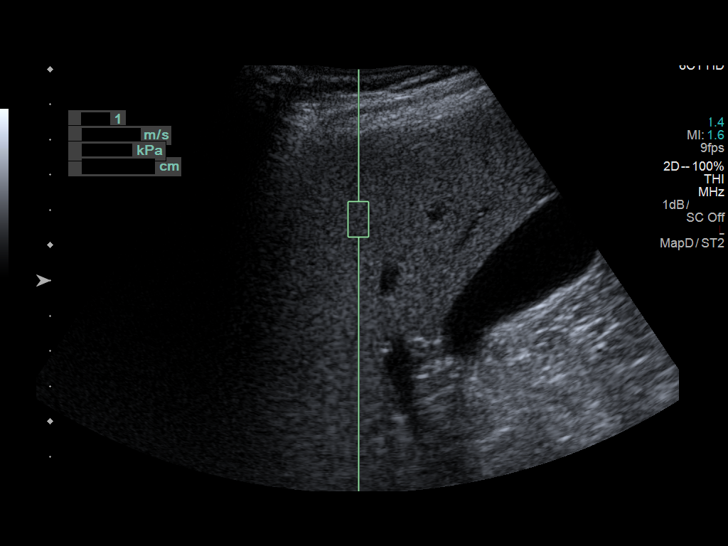
[im 10/16]
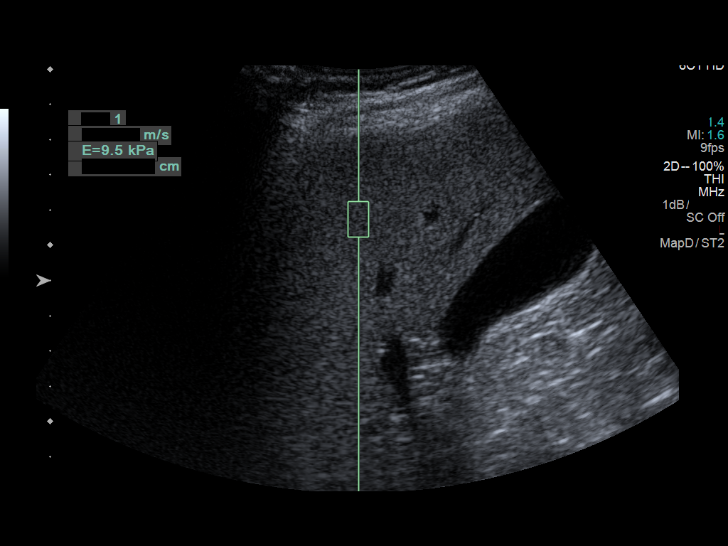
[im 11/16]
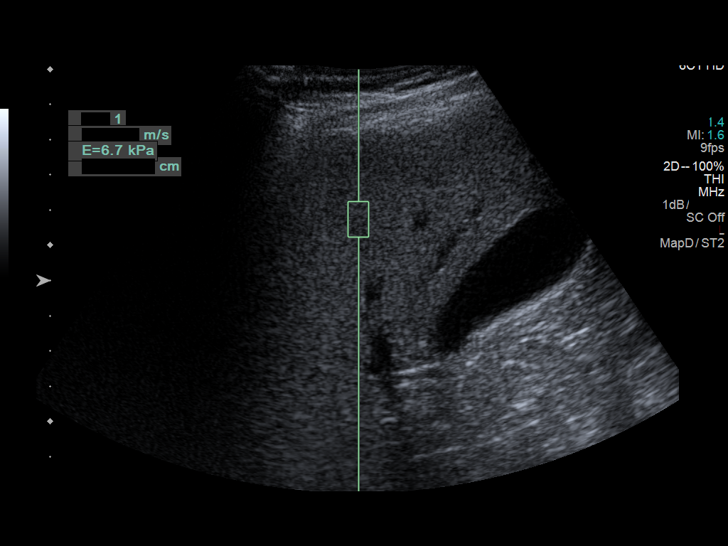
[im 12/16]
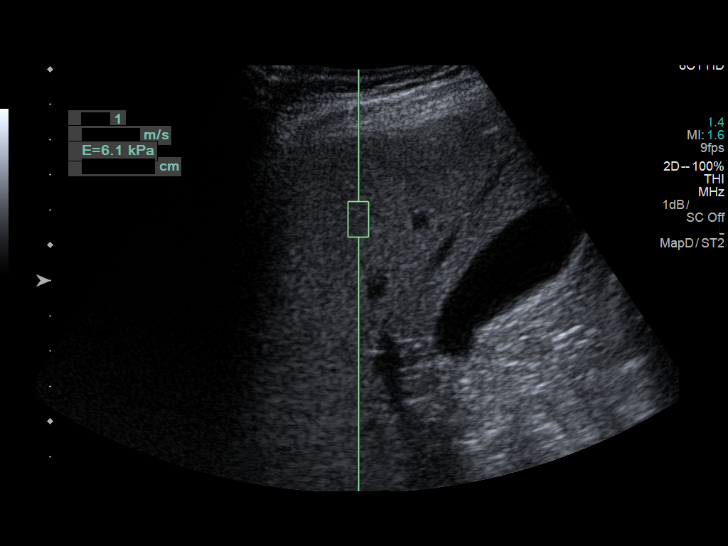
[im 13/16]
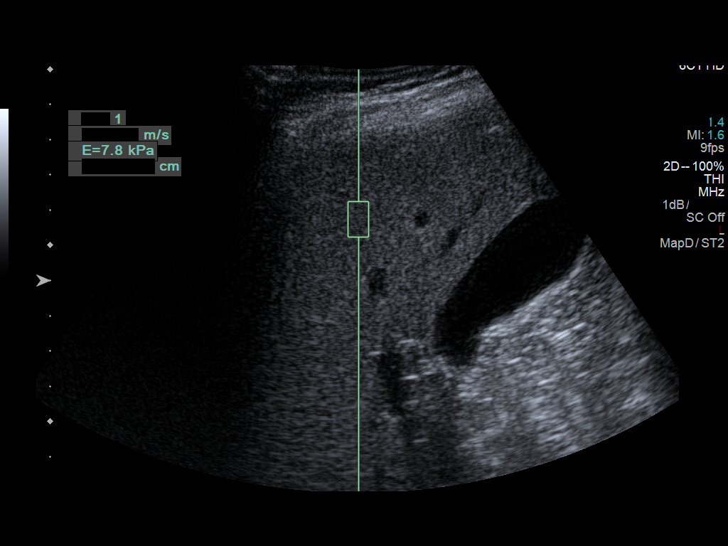
[im 15/16]
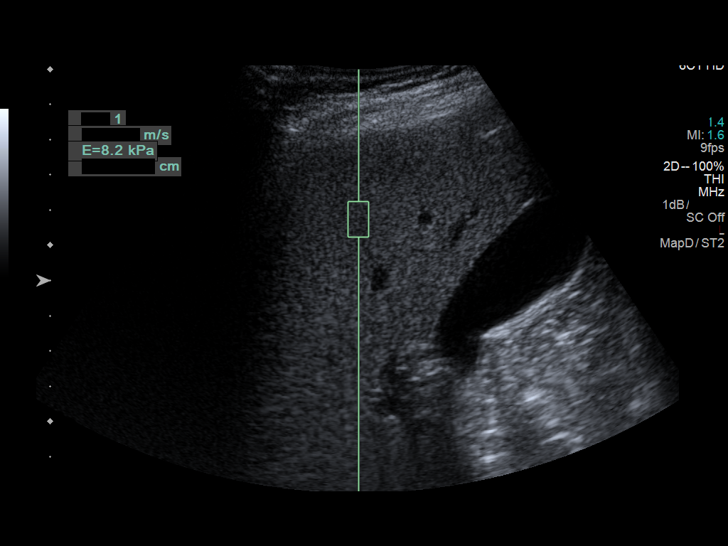
[im 16/16]
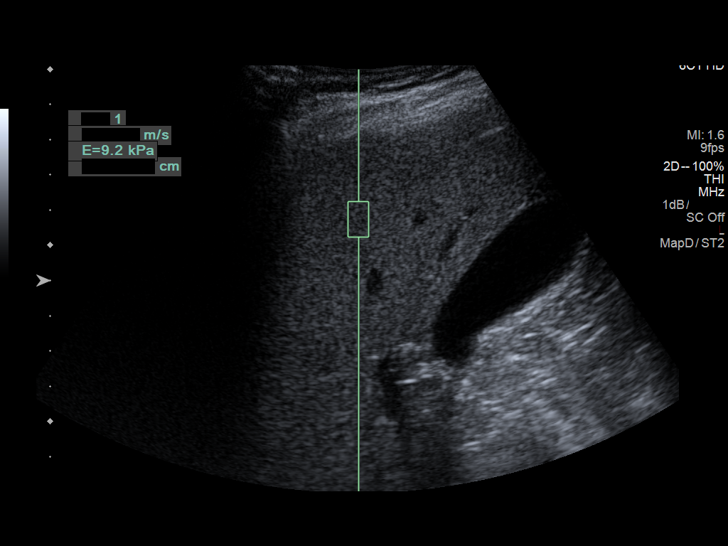

[13 of 16 positions shown; findings below may reference images not displayed]

FINDINGS: Device: Siemens Helix VTQ

Patient position: Supine

Transducer [id number]

Number of measurements: 10

Hepatic segment:  8

Median velocity:   1.6  m/sec

IQR:

IQR/Median velocity ratio:

Corresponding Metavir fibrosis score:  F2 + some F3

Risk of fibrosis: Moderate

Limitations of exam: None

Pertinent findings noted on other imaging exams: On today's study
there is an incidental hypoechoic area within the right hepatic lobe
measuring 9 mm, nonspecific. Increased echotexture noted in the
visualized liver suggesting fatty infiltration.

Please note that abnormal shear wave velocities may also be
identified in clinical settings other than with hepatic fibrosis,
such as: acute hepatitis, elevated right heart and central venous
pressures including use of beta blockers, Dario disease
(Subair), infiltrative processes such as
mastocytosis/amyloidosis/infiltrative tumor, extrahepatic
cholestasis, in the post-prandial state, and liver transplantation.
Correlation with patient history, laboratory data, and clinical
condition recommended.
IMPRESSION: Median hepatic shear wave velocity is calculated at 1.6 m/sec.

Corresponding Metavir fibrosis score is  F2 + some F3.

Risk of fibrosis is Moderate.

Follow-up: Additional testing appropriate

## 2018-01-24 DIAGNOSIS — K5100 Ulcerative (chronic) pancolitis without complications: Secondary | ICD-10-CM | POA: Diagnosis not present

## 2018-02-05 DIAGNOSIS — E119 Type 2 diabetes mellitus without complications: Secondary | ICD-10-CM | POA: Diagnosis not present

## 2018-02-05 DIAGNOSIS — E785 Hyperlipidemia, unspecified: Secondary | ICD-10-CM | POA: Diagnosis not present

## 2018-02-05 DIAGNOSIS — N478 Other disorders of prepuce: Secondary | ICD-10-CM | POA: Diagnosis not present

## 2018-02-05 DIAGNOSIS — R7989 Other specified abnormal findings of blood chemistry: Secondary | ICD-10-CM | POA: Diagnosis not present

## 2018-02-05 DIAGNOSIS — R011 Cardiac murmur, unspecified: Secondary | ICD-10-CM | POA: Diagnosis not present

## 2018-02-09 DIAGNOSIS — R7989 Other specified abnormal findings of blood chemistry: Secondary | ICD-10-CM | POA: Diagnosis not present

## 2018-02-09 DIAGNOSIS — E119 Type 2 diabetes mellitus without complications: Secondary | ICD-10-CM | POA: Diagnosis not present

## 2018-02-09 DIAGNOSIS — N478 Other disorders of prepuce: Secondary | ICD-10-CM | POA: Diagnosis not present

## 2018-02-09 DIAGNOSIS — E785 Hyperlipidemia, unspecified: Secondary | ICD-10-CM | POA: Diagnosis not present

## 2018-02-09 DIAGNOSIS — R011 Cardiac murmur, unspecified: Secondary | ICD-10-CM | POA: Diagnosis not present

## 2018-03-06 ENCOUNTER — Encounter: Payer: Self-pay | Admitting: Emergency Medicine

## 2018-03-06 ENCOUNTER — Ambulatory Visit: Payer: 59 | Admitting: Certified Registered Nurse Anesthetist

## 2018-03-06 ENCOUNTER — Ambulatory Visit
Admission: RE | Admit: 2018-03-06 | Discharge: 2018-03-06 | Disposition: A | Discharge status: Home / Self Care | Payer: 59 | Source: Ambulatory Visit | Attending: Unknown Physician Specialty | Admitting: Unknown Physician Specialty

## 2018-03-06 ENCOUNTER — Encounter
Admission: RE | Disposition: A | Discharge status: Home / Self Care | Payer: Self-pay | Source: Ambulatory Visit | Attending: Unknown Physician Specialty

## 2018-03-06 DIAGNOSIS — Z79899 Other long term (current) drug therapy: Secondary | ICD-10-CM | POA: Insufficient documentation

## 2018-03-06 DIAGNOSIS — G4730 Sleep apnea, unspecified: Secondary | ICD-10-CM | POA: Diagnosis not present

## 2018-03-06 DIAGNOSIS — K648 Other hemorrhoids: Secondary | ICD-10-CM | POA: Diagnosis not present

## 2018-03-06 DIAGNOSIS — Z888 Allergy status to other drugs, medicaments and biological substances status: Secondary | ICD-10-CM | POA: Diagnosis not present

## 2018-03-06 DIAGNOSIS — Z7982 Long term (current) use of aspirin: Secondary | ICD-10-CM | POA: Insufficient documentation

## 2018-03-06 DIAGNOSIS — Z91041 Radiographic dye allergy status: Secondary | ICD-10-CM | POA: Diagnosis not present

## 2018-03-06 DIAGNOSIS — K219 Gastro-esophageal reflux disease without esophagitis: Secondary | ICD-10-CM | POA: Insufficient documentation

## 2018-03-06 DIAGNOSIS — K640 First degree hemorrhoids: Secondary | ICD-10-CM | POA: Insufficient documentation

## 2018-03-06 DIAGNOSIS — Z8249 Family history of ischemic heart disease and other diseases of the circulatory system: Secondary | ICD-10-CM | POA: Diagnosis not present

## 2018-03-06 DIAGNOSIS — E119 Type 2 diabetes mellitus without complications: Secondary | ICD-10-CM | POA: Insufficient documentation

## 2018-03-06 DIAGNOSIS — K5100 Ulcerative (chronic) pancolitis without complications: Secondary | ICD-10-CM | POA: Diagnosis not present

## 2018-03-06 DIAGNOSIS — R011 Cardiac murmur, unspecified: Secondary | ICD-10-CM | POA: Insufficient documentation

## 2018-03-06 DIAGNOSIS — Z7984 Long term (current) use of oral hypoglycemic drugs: Secondary | ICD-10-CM | POA: Diagnosis not present

## 2018-03-06 DIAGNOSIS — K5190 Ulcerative colitis, unspecified, without complications: Secondary | ICD-10-CM | POA: Diagnosis not present

## 2018-03-06 DIAGNOSIS — K529 Noninfective gastroenteritis and colitis, unspecified: Secondary | ICD-10-CM | POA: Diagnosis not present

## 2018-03-06 DIAGNOSIS — E785 Hyperlipidemia, unspecified: Secondary | ICD-10-CM | POA: Diagnosis not present

## 2018-03-06 DIAGNOSIS — K760 Fatty (change of) liver, not elsewhere classified: Secondary | ICD-10-CM | POA: Diagnosis not present

## 2018-03-06 HISTORY — PX: COLONOSCOPY WITH PROPOFOL: SHX5780

## 2018-03-06 LAB — GLUCOSE, CAPILLARY: Glucose-Capillary: 95 mg/dL (ref 65–99)

## 2018-03-06 SURGERY — COLONOSCOPY WITH PROPOFOL
Anesthesia: General

## 2018-03-06 MED ORDER — PHENYLEPHRINE HCL 10 MG/ML IJ SOLN
INTRAMUSCULAR | Status: DC | PRN
  Administered 2018-03-06: 100 ug via INTRAVENOUS
  Administered 2018-03-06: 50 ug via INTRAVENOUS

## 2018-03-06 MED ORDER — LIDOCAINE HCL (CARDIAC) 20 MG/ML IV SOLN
INTRAVENOUS | Status: DC | PRN
  Administered 2018-03-06: 50 mg via INTRAVENOUS

## 2018-03-06 MED ORDER — PHENYLEPHRINE HCL 10 MG/ML IJ SOLN
INTRAMUSCULAR | Status: AC
  Filled 2018-03-06: qty 1

## 2018-03-06 MED ORDER — SODIUM CHLORIDE 0.9 % IV SOLN: INTRAVENOUS | Status: DC

## 2018-03-06 MED ORDER — PROPOFOL 10 MG/ML IV BOLUS
INTRAVENOUS | Status: DC | PRN
  Administered 2018-03-06: 30 mg via INTRAVENOUS
  Administered 2018-03-06: 50 mg via INTRAVENOUS

## 2018-03-06 MED ORDER — SODIUM CHLORIDE 0.9 % IV SOLN
INTRAVENOUS | Status: DC
  Administered 2018-03-06: 1000 mL via INTRAVENOUS

## 2018-03-06 MED ORDER — PROPOFOL 500 MG/50ML IV EMUL
INTRAVENOUS | Status: AC
Start: 2018-03-06 — End: ?
  Filled 2018-03-06: qty 50

## 2018-03-06 MED ORDER — PROPOFOL 500 MG/50ML IV EMUL
INTRAVENOUS | Status: DC | PRN
  Administered 2018-03-06: 150 ug/kg/min via INTRAVENOUS

## 2018-03-06 MED ORDER — LIDOCAINE HCL (PF) 2 % IJ SOLN
INTRAMUSCULAR | Status: AC
Start: 2018-03-06 — End: ?
  Filled 2018-03-06: qty 10

## 2018-03-06 NOTE — Anesthesia Preprocedure Evaluation (Signed)
Anesthesia Evaluation  Patient identified by MRN, date of birth, ID band Patient awake    Reviewed: Allergy & Precautions, NPO status , Patient's Chart, lab work & pertinent test results  History of Anesthesia Complications Negative for: history of anesthetic complications  Airway Mallampati: II       Dental   Pulmonary sleep apnea , neg COPD,           Cardiovascular (-) hypertension(-) Past MI and (-) CHF + Valvular Problems/Murmurs (murmur, no tx)      Neuro/Psych neg Seizures    GI/Hepatic Neg liver ROS, GERD  Medicated,  Endo/Other  diabetes, Type 2, Oral Hypoglycemic Agents  Renal/GU negative Renal ROS     Musculoskeletal   Abdominal   Peds  Hematology   Anesthesia Other Findings   Reproductive/Obstetrics                             Anesthesia Physical Anesthesia Plan  ASA: II  Anesthesia Plan: General   Post-op Pain Management:    Induction: Intravenous  PONV Risk Score and Plan: 2 and Propofol infusion and TIVA  Airway Management Planned:   Additional Equipment:   Intra-op Plan:   Post-operative Plan:   Informed Consent: I have reviewed the patients History and Physical, chart, labs and discussed the procedure including the risks, benefits and alternatives for the proposed anesthesia with the patient or authorized representative who has indicated his/her understanding and acceptance.     Plan Discussed with:   Anesthesia Plan Comments:         Anesthesia Quick Evaluation

## 2018-03-06 NOTE — Transfer of Care (Signed)
Immediate Anesthesia Transfer of Care Note  Patient: Dalton Cole  Procedure(s) Performed: COLONOSCOPY WITH PROPOFOL (N/A )  Patient Location: PACU  Anesthesia Type:MAC  Level of Consciousness: drowsy  Airway & Oxygen Therapy: Patient Spontanous Breathing and Patient connected to nasal cannula oxygen  Post-op Assessment: Report given to RN and Post -op Vital signs reviewed and stable  Post vital signs: Reviewed and stable  Last Vitals:  Vitals Value Taken Time  BP 91/71 03/06/2018  1:45 PM  Temp 36.3 C 03/06/2018  1:45 PM  Pulse 82 03/06/2018  1:45 PM  Resp 19 03/06/2018  1:45 PM  SpO2 100 % 03/06/2018  1:45 PM    Last Pain:  Vitals:   03/06/18 1345  TempSrc: Tympanic  PainSc: Asleep         Complications: No apparent anesthesia complications

## 2018-03-06 NOTE — Anesthesia Postprocedure Evaluation (Signed)
Anesthesia Post Note  Patient: Dalton Cole  Procedure(s) Performed: COLONOSCOPY WITH PROPOFOL (N/A )  Patient location during evaluation: PACU Anesthesia Type: General Level of consciousness: awake and alert Pain management: pain level controlled Vital Signs Assessment: post-procedure vital signs reviewed and stable Respiratory status: spontaneous breathing and respiratory function stable Cardiovascular status: stable Anesthetic complications: no     Last Vitals:  Vitals:   03/06/18 1355 03/06/18 1405  BP: 113/76 114/83  Pulse: 77   Resp:    Temp:    SpO2: 100% 100%    Last Pain:  Vitals:   03/06/18 1355  TempSrc:   PainSc: 0-No pain                 KEPHART,WILLIAM K

## 2018-03-06 NOTE — Anesthesia Post-op Follow-up Note (Signed)
Anesthesia QCDR form completed.        

## 2018-03-06 NOTE — Anesthesia Procedure Notes (Addendum)
Procedure Name: MAC Date/Time: 03/06/2018 1:25 PM Performed by: Rudean Hitt, CRNA Pre-anesthesia Checklist: Patient identified, Emergency Drugs available, Suction available, Patient being monitored and Timeout performed Patient Re-evaluated:Patient Re-evaluated prior to induction Oxygen Delivery Method: Nasal cannula Induction Type: IV induction

## 2018-03-06 NOTE — H&P (Signed)
Primary Care Physician:  Tracie Harrier, MD Primary Gastroenterologist:  Dr. Vira Agar  Pre-Procedure History & Physical: HPI:  Dalton Cole is a 42 y.o. male is here for an colonoscopy.  This is for ulcerative colitis.   Past Medical History:  Diagnosis Date  . Diabetes mellitus without complication ([place]) 7902  . Elevated liver enzymes   . Fatty liver   . GERD (gastroesophageal reflux disease)   . Heart murmur   . Hepatomegaly   . Hyperlipemia     Past Surgical History:  Procedure Laterality Date  . COLONOSCOPY  2013?   bloody stool/[place]  . COLONOSCOPY WITH PROPOFOL N/A 09/13/2017   Procedure: COLONOSCOPY WITH PROPOFOL;  Surgeon: Kjerstin Abrigo Bellow, MD;  Location: [facility] ENDOSCOPY;  Service: Endoscopy;  Laterality: N/A;  . EAR CANALOPLASTY    . ESOPHAGOGASTRODUODENOSCOPY (EGD) WITH PROPOFOL N/A 09/13/2017   Procedure: ESOPHAGOGASTRODUODENOSCOPY (EGD) WITH PROPOFOL;  Surgeon: Gyselle Matthew Bellow, MD;  Location: [place] [facility] ENDOSCOPY;  Service: Endoscopy;  Laterality: N/A;  . IR FIBRIN GLUE REPAIR ANAL FISTULA    . NASAL SEPTUM SURGERY      Prior to Admission medications   Medication Sig Start Date End Date Taking? Authorizing Provider  aspirin EC 81 MG tablet Take 81 mg by mouth daily.   Yes [provider]  atorvastatin (LIPITOR) 10 MG tablet Take 10 mg by mouth 2 (two) times a week.   Yes [provider]  cyanocobalamin 1000 MCG tablet Take 1,000 mcg by mouth daily.   Yes [provider]  lisinopril (ZESTRIL) 2.5 MG tablet Take 1 tablet (2.5 mg total) by mouth daily. 06/12/15  Yes Roselee Nova, MD  mesalamine (LIALDA) 1.2 g EC tablet Take 1.2 g by mouth daily with breakfast.   Yes [provider]  Multiple Vitamin (MULTI-VITAMINS) TABS Take by mouth.   Yes [provider]  pantoprazole (PROTONIX) 40 MG tablet Take 1 tablet (40 mg total) by mouth daily. 06/02/15  Yes Rochel Brome A, MD  pioglitazone (ACTOS) 30 MG  tablet Take 30 mg by mouth daily.   Yes [provider]  polyethylene glycol powder (GLYCOLAX/MIRALAX) powder 255 grams one bottle for colonoscopy prep 07/13/17  Yes Byrnett, Forest Gleason, MD  predniSONE (DELTASONE) 10 MG tablet Take 1 tablet (10 mg total) by mouth daily. Day 1-2: Take 50 mg  ( 5 pills) Day 3-4 : Take 40 mg (4pills) Day 5-6: Take 30 mg (3 pills) Day 7-8:  Take 20 mg (2 pills) Day 9:  Take 63m (1 pill) Patient not taking: Reported on 09/13/2017 07/24/17   RMerlyn Lot MD  sitaGLIPtin-metformin (JANUMET) 50-1000 MG tablet Take 1 tablet by mouth 2 (two) times daily with a meal.  01/02/17 01/02/18  [provider]    Allergies as of 02/26/2018 - Review Complete 09/13/2017  Allergen Reaction Noted  . Contrast media [iodinated diagnostic agents] Hives, Swelling, and Cough 07/24/2017    Family History  Problem Relation Age of Onset  . Heart disease Mother   . Stroke Mother   . Diabetes Father   . Alcohol abuse Brother   . Colon cancer Neg Hx   . Liver disease Neg Hx     Social History   Socioeconomic History  . Marital status: Married    Spouse name: Not on file  . Number of children: 0  . Years of education: Not on file  . Highest education level: Not on file  Occupational History  . Occupation: ATM sPress photographer  Social Needs  . Financial resource strain: Not on file  . Food insecurity:    Worry: Not on file    Inability: Not on file  . Transportation needs:    Medical: Not on file    Non-medical: Not on file  Tobacco Use  . Smoking status: Never Smoker  . Smokeless tobacco: Never Used  Substance and Sexual Activity  . Alcohol use: Yes    Alcohol/week: 0.0 oz    Comment: none in 2 weeks  . Drug use: No  . Sexual activity: Yes  Lifestyle  . Physical activity:    Days per week: Not on file    Minutes per session: Not on file  . Stress: Not on file  Relationships  . Social connections:    Talks on phone: Not on file    Gets together: Not  on file    Attends religious service: Not on file    Active member of club or organization: Not on file    Attends meetings of clubs or organizations: Not on file    Relationship status: Not on file  . Intimate partner violence:    Fear of current or ex partner: Not on file    Emotionally abused: Not on file    Physically abused: Not on file    Forced sexual activity: Not on file  Other Topics Concern  . Not on file  Social History Narrative   Sells [employer], married, no children    Review of Systems: See HPI, otherwise negative ROS  Physical Exam: BP (!) 124/93   Pulse 73   Temp 98.8 F (37.1 C) (Oral)   Resp 16   Ht _0  (1.727 m)   Wt 69.9 kg (154 lb)   SpO2 100%   BMI 23.42 kg/m  General:   Alert,  pleasant and cooperative in NAD Head:  Normocephalic and atraumatic. Neck:  Supple; no masses or thyromegaly. Lungs:  Clear throughout to auscultation.    Heart:  Regular rate and rhythm. Abdomen:  Soft, nontender and nondistended. Normal bowel sounds, without guarding, and without rebound.   Neurologic:  Alert and  oriented x4;  grossly normal neurologically.  Impression/Plan: Dalton Cole is here for an colonoscopy to be performed for ulcerative colitis.  Risks, benefits, limitations, and alternatives regarding  colonoscopy have been reviewed with the patient.  Questions have been answered.  All parties agreeable.   Gaylyn Cheers, MD  03/06/2018, 1:20 PM

## 2018-03-06 NOTE — Op Note (Signed)
[facility] Patient Name: Dalton Cole Procedure Date: 03/06/2018 12:59 PM MRN: [id number] Account #: [id number] Date of Birth: Sep 20, 1976 Admit Type: Outpatient Age: 42 Room: [facility] ENDO ROOM 1 Gender: Male Note Status: Finalized Procedure:            Colonoscopy Indications:          Follow-up of ulcerative colitis Providers:            Manya Silvas, MD Referring MD:         Tracie Harrier, MD (Referring MD) Medicines:            Propofol per Anesthesia Complications:        No immediate complications. Procedure:            Pre-Anesthesia Assessment:                       - After reviewing the risks and benefits, the patient                        was deemed in satisfactory condition to undergo the                        procedure.                       After obtaining informed consent, the colonoscope was                        passed under direct vision. Throughout the procedure,                        the patient's blood pressure, pulse, and oxygen                        saturations were monitored continuously. The                        Colonoscope was introduced through the anus and                        advanced to the the cecum, identified by appendiceal                        orifice and ileocecal valve. The colonoscopy was                        performed without difficulty. The patient tolerated the                        procedure well. The quality of the bowel preparation                        was excellent. Findings:      Internal hemorrhoids were found during perianal exam and during       endoscopy. The hemorrhoids were small and Grade I (internal hemorrhoids       that do not prolapse).      The colon looked extremely good with the distal colon showing normal       vascular appearance, as well as the proximal colon. The area of the       descending colon and  distal transverse showed less vascularity of the      mucosa but no obvious inflammation.      Biopsies done of the colon twice in ascending proximal and distal       ascending colon. Bx done of transverse colon, descending and sigmoid       colon.      Rectal exam showed normal prostate for age. Impression:           - Internal hemorrhoids.                       - No specimens collected. Recommendation:       - Await pathology results. Manya Silvas, MD 03/06/2018 1:47:22 PM This report has been signed electronically. Number of Addenda: 0 Note Initiated On: 03/06/2018 12:59 PM Scope Withdrawal Time: 0 hours 7 minutes 39 seconds  Total Procedure Duration: 0 hours 11 minutes 13 seconds       [facility]

## 2018-03-08 ENCOUNTER — Encounter: Payer: Self-pay | Admitting: Unknown Physician Specialty

## 2018-03-09 LAB — SURGICAL PATHOLOGY

## 2018-04-11 DIAGNOSIS — E119 Type 2 diabetes mellitus without complications: Secondary | ICD-10-CM | POA: Diagnosis not present

## 2018-04-11 DIAGNOSIS — G4733 Obstructive sleep apnea (adult) (pediatric): Secondary | ICD-10-CM | POA: Diagnosis not present

## 2018-04-11 DIAGNOSIS — K219 Gastro-esophageal reflux disease without esophagitis: Secondary | ICD-10-CM | POA: Diagnosis not present

## 2018-04-11 DIAGNOSIS — D649 Anemia, unspecified: Secondary | ICD-10-CM | POA: Diagnosis not present

## 2018-04-11 DIAGNOSIS — K5190 Ulcerative colitis, unspecified, without complications: Secondary | ICD-10-CM | POA: Diagnosis not present

## 2018-04-17 DIAGNOSIS — K219 Gastro-esophageal reflux disease without esophagitis: Secondary | ICD-10-CM | POA: Diagnosis not present

## 2018-04-17 DIAGNOSIS — E119 Type 2 diabetes mellitus without complications: Secondary | ICD-10-CM | POA: Diagnosis not present

## 2018-04-17 DIAGNOSIS — K5100 Ulcerative (chronic) pancolitis without complications: Secondary | ICD-10-CM | POA: Diagnosis not present

## 2018-04-17 DIAGNOSIS — Z23 Encounter for immunization: Secondary | ICD-10-CM | POA: Diagnosis not present

## 2018-04-17 DIAGNOSIS — Z0000 Encounter for general adult medical examination without abnormal findings: Secondary | ICD-10-CM | POA: Diagnosis not present

## 2018-08-21 DIAGNOSIS — K5100 Ulcerative (chronic) pancolitis without complications: Secondary | ICD-10-CM | POA: Diagnosis not present

## 2018-08-21 DIAGNOSIS — E119 Type 2 diabetes mellitus without complications: Secondary | ICD-10-CM | POA: Diagnosis not present

## 2018-08-21 DIAGNOSIS — Z125 Encounter for screening for malignant neoplasm of prostate: Secondary | ICD-10-CM | POA: Diagnosis not present

## 2018-08-21 DIAGNOSIS — Z0000 Encounter for general adult medical examination without abnormal findings: Secondary | ICD-10-CM | POA: Diagnosis not present

## 2018-08-21 DIAGNOSIS — K219 Gastro-esophageal reflux disease without esophagitis: Secondary | ICD-10-CM | POA: Diagnosis not present

## 2018-08-28 DIAGNOSIS — E538 Deficiency of other specified B group vitamins: Secondary | ICD-10-CM | POA: Diagnosis not present

## 2018-08-28 DIAGNOSIS — E119 Type 2 diabetes mellitus without complications: Secondary | ICD-10-CM | POA: Diagnosis not present

## 2018-08-28 DIAGNOSIS — E785 Hyperlipidemia, unspecified: Secondary | ICD-10-CM | POA: Diagnosis not present

## 2018-08-28 DIAGNOSIS — D649 Anemia, unspecified: Secondary | ICD-10-CM | POA: Diagnosis not present

## 2018-09-26 DIAGNOSIS — H524 Presbyopia: Secondary | ICD-10-CM | POA: Diagnosis not present

## 2018-10-04 DIAGNOSIS — H7191 Unspecified cholesteatoma, right ear: Secondary | ICD-10-CM | POA: Diagnosis not present

## 2018-10-04 DIAGNOSIS — H6121 Impacted cerumen, right ear: Secondary | ICD-10-CM | POA: Diagnosis not present

## 2018-10-15 ENCOUNTER — Other Ambulatory Visit: Payer: Self-pay | Admitting: Unknown Physician Specialty

## 2018-10-15 DIAGNOSIS — H7190 Unspecified cholesteatoma, unspecified ear: Secondary | ICD-10-CM

## 2018-10-15 DIAGNOSIS — H90A11 Conductive hearing loss, unilateral, right ear with restricted hearing on the contralateral side: Secondary | ICD-10-CM | POA: Diagnosis not present

## 2018-10-15 DIAGNOSIS — H9011 Conductive hearing loss, unilateral, right ear, with unrestricted hearing on the contralateral side: Secondary | ICD-10-CM | POA: Diagnosis not present

## 2018-10-22 ENCOUNTER — Ambulatory Visit
Admission: RE | Admit: 2018-10-22 | Discharge: 2018-10-22 | Disposition: A | Discharge status: Home / Self Care | Payer: 59 | Source: Ambulatory Visit | Attending: Unknown Physician Specialty | Admitting: Unknown Physician Specialty

## 2018-10-22 DIAGNOSIS — H7190 Unspecified cholesteatoma, unspecified ear: Secondary | ICD-10-CM | POA: Insufficient documentation

## 2018-10-22 DIAGNOSIS — R9389 Abnormal findings on diagnostic imaging of other specified body structures: Secondary | ICD-10-CM | POA: Diagnosis not present

## 2018-10-22 DIAGNOSIS — M8588 Other specified disorders of bone density and structure, other site: Secondary | ICD-10-CM | POA: Insufficient documentation

## 2018-10-22 DIAGNOSIS — H7113 Cholesteatoma of tympanum, bilateral: Secondary | ICD-10-CM | POA: Diagnosis not present

## 2018-10-31 DIAGNOSIS — H7191 Unspecified cholesteatoma, right ear: Secondary | ICD-10-CM | POA: Diagnosis not present

## 2018-10-31 DIAGNOSIS — H902 Conductive hearing loss, unspecified: Secondary | ICD-10-CM | POA: Diagnosis not present

## 2018-12-03 DIAGNOSIS — E119 Type 2 diabetes mellitus without complications: Secondary | ICD-10-CM | POA: Diagnosis not present

## 2018-12-03 DIAGNOSIS — Z23 Encounter for immunization: Secondary | ICD-10-CM | POA: Diagnosis not present

## 2018-12-03 DIAGNOSIS — E785 Hyperlipidemia, unspecified: Secondary | ICD-10-CM | POA: Diagnosis not present

## 2018-12-03 DIAGNOSIS — E538 Deficiency of other specified B group vitamins: Secondary | ICD-10-CM | POA: Diagnosis not present

## 2019-01-23 ENCOUNTER — Ambulatory Visit (INDEPENDENT_AMBULATORY_CARE_PROVIDER_SITE_OTHER): Payer: Self-pay | Admitting: Physician Assistant

## 2019-01-23 VITALS — BP 118/78 | HR 92 | Temp 98.4°F | Resp 18 | Wt 161.0 lb

## 2019-01-23 DIAGNOSIS — L739 Follicular disorder, unspecified: Secondary | ICD-10-CM

## 2019-01-23 LAB — POCT INFLUENZA A/B
INFLUENZA A, POC: NEGATIVE
INFLUENZA B, POC: NEGATIVE

## 2019-01-23 MED ORDER — CLOBETASOL PROPIONATE 0.05 % EX FOAM: Freq: Two times a day (BID) | CUTANEOUS | 0 refills | Status: AC

## 2019-01-23 MED ORDER — SULFAMETHOXAZOLE-TRIMETHOPRIM 800-160 MG PO TABS: 1.0000 | ORAL_TABLET | Freq: Two times a day (BID) | ORAL | 0 refills | Status: AC

## 2019-01-23 NOTE — Progress Notes (Addendum)
Patient ID: Dalton Cole DOB: 02-Dec-1976 AGE: 43 y.o. MRN: [id number]   PCP: Tracie Harrier, MD   Chief Complaint:  Chief Complaint  Patient presents with  . Focus- fever, bodyaches, (pimples In head -use cocunut oil)2     Subjective:    HPI:  Dalton Cole is a 43 y.o. male presents for evaluation  Chief Complaint  Patient presents with  . Focus- fever, bodyaches, (pimples In head -use cocunut oil)22    43 year old male presents to [facility] with two day history of fever (tmax 1F) and bodyaches. Associated 2-3 days of rhinorrhea, nasal congestion and dry cough. Patient also with two days of scalp pain. Began day after using topical coconut oil (believes he has used before on his scalp, however, was a different brand). Following day developed erythematous papules all over scalp. Scalp feels tight and swollen. Tender to palpation. Significant discomfort with shampooing hair this morning. Noticed scant purulent drainage from scalp. Has taken OTC Dayquil with minimal symptom improvement. Denies dizziness/lightheadedness, headache, ear pain, sinus pain, sore throat, chest pain, SOB, wheezing, nausea/vomiting, diarrhea. Patient did receive this season's flu vaccination. Patient states his wife is currently ill with URI symptoms, though mild. Patient denies previous history of folliculitis, scalp rash, or MRSA.  Patient regularly followed by Dr. Tracie Harrier MD with [facility] in [place]. Last seen 08/28/2018. Patient treated for DM2 (without use of insulin), hyperlipidemia, GERD, and ulcerative colitis. Currently on Actos, Janumet, Lipitor, Protonix, and Lialda (NSAID for UC). Last A1C 7.0.  A limited review of symptoms was performed, pertinent positives and negatives as mentioned in HPI.  The following portions of the patient's history were reviewed and updated as appropriate: allergies, current medications and past medical  history.  Patient Active Problem List   Diagnosis Date Noted  . Diarrhea 07/14/2017  . Right lower quadrant abdominal pain 07/14/2017  . SIRS (systemic inflammatory response syndrome) ([place]) 01/19/2017  . Diabetes mellitus type 2, uncontrolled ([place]) 06/12/2015  . Elevated liver function tests 06/05/2015  . Hepatic steatosis 06/05/2015  . Hyperlipidemia 06/02/2015  . Elevated liver enzymes 06/02/2015  . Elevated serum GGT level 06/02/2015  . GERD (gastroesophageal reflux disease) 06/02/2015    Allergies  Allergen Reactions  . Contrast Media [Iodinated Diagnostic Agents] Hives, Swelling and Cough    Pt c/o hives, cough, sneezing, and mild swelling/itching of throat after contrast media injection.    . Metrizamide Hives and Swelling    Current Outpatient Medications on File Prior to Visit  Medication Sig Dispense Refill  . aspirin EC 81 MG tablet Take 81 mg by mouth daily.    Marland Kitchen atorvastatin (LIPITOR) 10 MG tablet Take 10 mg by mouth 2 (two) times a week.    . cyanocobalamin 1000 MCG tablet Take 1,000 mcg by mouth daily.    Marland Kitchen lisinopril (ZESTRIL) 2.5 MG tablet Take 1 tablet (2.5 mg total) by mouth daily. 90 tablet 1  . mesalamine (LIALDA) 1.2 g EC tablet Take 1.2 g by mouth daily with breakfast.    . Multiple Vitamin (MULTI-VITAMINS) TABS Take by mouth.    . pantoprazole (PROTONIX) 40 MG tablet Take 1 tablet (40 mg total) by mouth daily. 90 tablet 0  . pioglitazone (ACTOS) 30 MG tablet Take 30 mg by mouth daily.    . polyethylene glycol powder (GLYCOLAX/MIRALAX) powder 255 grams one bottle for colonoscopy prep 255 g 0  . predniSONE (DELTASONE) 10 MG tablet Take 1 tablet (10 mg total) by mouth daily. Day  1-2: Take 50 mg  ( 5 pills) Day 3-4 : Take 40 mg (4pills) Day 5-6: Take 30 mg (3 pills) Day 7-8:  Take 20 mg (2 pills) Day 9:  Take 20m (1 pill) 29 tablet 0  . sitaGLIPtin-metformin (JANUMET) 50-1000 MG tablet Take 1 tablet by mouth 2 (two) times daily with a meal.      No current  facility-administered medications on file prior to visit.        Objective:   Vitals:   01/23/19 1232  BP: 118/78  Pulse: 92  Resp: 18  Temp: 98.4 F (36.9 C)  SpO2: 98%     Wt Readings from Last 3 Encounters:  01/23/19 161 lb (73 kg)  03/06/18 154 lb (69.9 kg)  09/13/17 152 lb (68.9 kg)    Physical Exam:   General Appearance:  Patient sitting comfortably on examination table. Conversational. GKermit Baloself-historian. In no acute distress. Afebrile.   Head:  Normocephalic, without obvious abnormality, atraumatic  Eyes:  PERRL, conjunctiva/corneas clear, EOM's intact  Ears:  Bilateral ear canals WNL. No erythema or edema. No discharge/drainage. Bilateral TMs WNL. No erythema, injection, or serous effusion. No scar tissue.  Nose: Nares normal, septum midline. Nasal mucosa with minimal edema. Scant clear rhinorrhea. No sinus tenderness with percussion/palpation.  Throat: Lips, mucosa, and tongue normal; teeth and gums normal. Throat reveals no erythema. Tonsils with no enlargement or exudate.  Neck: Supple, symmetrical, trachea midline. Palpable bilateral posterior cervical lymphnode chain adenopathy; tender to palpation.  Lungs:   Clear to auscultation bilaterally, respirations unlabored. Good aeration. No rales, rhonchi, crackles or wheezing. No cough during examination.  Heart:  Regular rate and rhythm, S1 and S2 normal, no murmur, rub, or gallop  Extremities: Extremities normal, atraumatic, no cyanosis or edema  Pulses: 2+ and symmetric  Skin: Patient with full head of hair, thick, black. Entire scalp reveals faint erythema and mild edema. Pustules at hair follicles with scant purulent drainage (thick white/pale yellow). Significant tenderness with palpation over scalp. No active bleeding.  Lymph nodes: Cervical, supraclavicular, and axillary nodes normal  Neurologic: Normal    Assessment & Plan:    Exam findings, diagnosis etiology and medication use and indications reviewed  with patient. Follow-Up and discharge instructions provided. No emergent/urgent issues found on exam.  Patient education was provided.   Patient verbalized understanding of information provided and agrees with plan of care (POC), all questions answered. The patient is advised to call or return to clinic if condition does not see an improvement in symptoms, or to seek the care of the closest emergency department if condition worsens with the below plan.    1. Folliculitis - sulfamethoxazole-trimethoprim (BACTRIM DS) 800-160 MG tablet; Take 1 tablet by mouth 2 (two) times daily for 7 days.  Dispense: 14 tablet; Refill: 0 - clobetasol (OLUX) 0.05 % topical foam; Apply topically 2 (two) times daily for 7 days.  Dispense: 100 g; Refill: 0 - POCT Influenza A/B  Patient with two day history of scalp rash. Suspect bacterial folliculitis secondary to occlusive dressing of coconut oil. Believe fever, body aches, and posterior cervical lymphadenopathy due to folliculitis. Prescribed Bactrim and Clobetasol topical foam. Advised OTC ibuprofen for pain/inflammation. Advised patient follow up with PCP or urgent care in two days if symptoms not improving; at that time, wound culture may be warranted.  In regards to 2-3 days of URI symptoms; suspect mild self-limited viral URI. Negative rapid flu test. Advised increase fluids, rest, and use of over the counter  cough & cold medication for symptom relief. Patient agreed with plan.  Patient charged for two [id number] visits (folliculitis and URI/flu concern).    Darlin Priestly, MHS, PA-C Montey Hora, MHS, PA-C Advanced Practice Provider [facility]  [address] [facility], [place], [place] [address] (p):  [phone] [email or web address]

## 2019-01-23 NOTE — Patient Instructions (Addendum)
Thank you for choosing [facility] for your health care needs.  You have been diagnosed with folliculitis (infection at the hair follicles).  Recommend over the counter ibuprofen for pain and inflammation. May use over the counter Bacitracin ointment on scalp.  You have been prescribed an oral antibiotic, Bactrim. Take 1 pill twice a day x 7 days. You have been prescribed a topical steroid, Clobetasol foam. Apply twice a day x 7 days. Will help with pain and inflammation.  Follow-up with family physician or urgent care in two days if symptoms not improving. Sooner with any worsening symptoms.  In regards to cold symptoms: Your rapid flu test was negative.  Recommend increase fluids, rest, and take over the counter cough & cold medication for symptom relief.  Folliculitis  Folliculitis is inflammation of the hair follicles. Folliculitis most commonly occurs on the scalp, thighs, legs, back, and buttocks. However, it can occur anywhere on the body. What are the causes? This condition may be caused by:  A bacterial infection (common).  A fungal infection.  A viral infection.  Coming into contact with certain chemicals, especially oils and tars.  Shaving or waxing.  Applying greasy ointments or creams to your skin often. Long-lasting folliculitis and folliculitis that keeps coming back can be caused by bacteria that live in the nostrils. What increases the risk? This condition is more likely to develop in people with:  A weakened immune system.  Diabetes.  Obesity. What are the signs or symptoms? Symptoms of this condition include:  Redness.  Soreness.  Swelling.  Itching.  Small white or yellow, pus-filled, itchy spots (pustules) that appear over a reddened area. If there is an infection that goes deep into the follicle, these may develop into a boil (furuncle).  A group of closely packed boils (carbuncle). These tend to form in hairy, sweaty areas of the body. How  is this diagnosed? This condition is diagnosed with a skin exam. To find what is causing the condition, your health care provider may take a sample of one of the pustules or boils for testing. How is this treated? This condition may be treated by:  Applying warm compresses to the affected areas.  Taking an antibiotic medicine or applying an antibiotic medicine to the skin.  Applying or bathing with an antiseptic solution.  Taking an over-the-counter medicine to help with itching.  Having a procedure to drain any pustules or boils. This may be done if a pustule or boil contains a lot of pus or fluid.  Laser hair removal. This may be done to treat long-lasting folliculitis. Follow these instructions at home:  If directed, apply heat to the affected area as often as told by your health care provider. Use the heat source that your health care provider recommends, such as a moist heat pack or a heating pad. ? Place a towel between your skin and the heat source. ? Leave the heat on for 20-30 minutes. ? Remove the heat if your skin turns bright red. This is especially important if you are unable to feel pain, heat, or cold. You may have a greater risk of getting burned.  If you were prescribed an antibiotic medicine, use it as told by your health care provider. Do not stop using the antibiotic even if you start to feel better.  Take over-the-counter and prescription medicines only as told by your health care provider.  Do not shave irritated skin.  Keep all follow-up visits as told by your health care provider.  This is important. Get help right away if:  You have more redness, swelling, or pain in the affected area.  Red streaks are spreading from the affected area.  You have a fever. This information is not intended to replace advice given to you by your health care provider. Make sure you discuss any questions you have with your health care provider. Document Released: 01/30/2002  Document Revised: 06/10/2016 Document Reviewed: 09/11/2015 Elsevier Interactive Patient Education  2019 [employer].

## 2019-01-30 ENCOUNTER — Telehealth: Payer: Self-pay | Admitting: Emergency Medicine

## 2019-01-30 NOTE — Telephone Encounter (Signed)
Spoke with patient whom informed me that he is doing good. This was a follow up call from visit with [facility].

## 2019-12-10 ENCOUNTER — Other Ambulatory Visit: Payer: Self-pay

## 2019-12-10 ENCOUNTER — Encounter: Payer: Self-pay | Admitting: Oncology

## 2019-12-10 DIAGNOSIS — R011 Cardiac murmur, unspecified: Secondary | ICD-10-CM | POA: Insufficient documentation

## 2019-12-10 NOTE — Progress Notes (Signed)
Patient reffered by Dr. Alice Reichert for iron deficiency anemia. Courtesy call was given to patient. Patient was given directions on how to get to cancer center and was appreciative. He is aware he is coming for IDA and had no further questions. No other concerns voiced.

## 2019-12-11 ENCOUNTER — Inpatient Hospital Stay: Payer: No Typology Code available for payment source

## 2019-12-11 ENCOUNTER — Inpatient Hospital Stay: Payer: No Typology Code available for payment source | Attending: Oncology | Admitting: Oncology

## 2019-12-11 ENCOUNTER — Encounter: Payer: Self-pay | Admitting: Oncology

## 2019-12-11 ENCOUNTER — Other Ambulatory Visit: Payer: Self-pay

## 2019-12-11 VITALS — BP 120/80 | HR 81 | Temp 97.4°F | Resp 16 | Wt 168.0 lb

## 2019-12-11 DIAGNOSIS — D500 Iron deficiency anemia secondary to blood loss (chronic): Secondary | ICD-10-CM

## 2019-12-11 DIAGNOSIS — D509 Iron deficiency anemia, unspecified: Secondary | ICD-10-CM

## 2019-12-11 DIAGNOSIS — K5180 Other ulcerative colitis without complications: Secondary | ICD-10-CM

## 2019-12-11 DIAGNOSIS — R238 Other skin changes: Secondary | ICD-10-CM | POA: Diagnosis not present

## 2019-12-11 DIAGNOSIS — R233 Spontaneous ecchymoses: Secondary | ICD-10-CM

## 2019-12-11 LAB — IRON AND TIBC
Iron: 59 ug/dL (ref 45–182)
Saturation Ratios: 11 % — ABNORMAL LOW (ref 17.9–39.5)
TIBC: 536 ug/dL — ABNORMAL HIGH (ref 250–450)
UIBC: 477 ug/dL

## 2019-12-11 LAB — PROTIME-INR
INR: 0.9 (ref 0.8–1.2)
Prothrombin Time: 12.4 seconds (ref 11.4–15.2)

## 2019-12-11 LAB — CBC WITH DIFFERENTIAL/PLATELET
Abs Immature Granulocytes: 0.01 10*3/uL (ref 0.00–0.07)
Basophils Absolute: 0 10*3/uL (ref 0.0–0.1)
Basophils Relative: 1 %
Eosinophils Absolute: 0.1 10*3/uL (ref 0.0–0.5)
Eosinophils Relative: 2 %
HCT: 41.7 % (ref 39.0–52.0)
Hemoglobin: 13.2 g/dL (ref 13.0–17.0)
Immature Granulocytes: 0 %
Lymphocytes Relative: 32 %
Lymphs Abs: 1.7 10*3/uL (ref 0.7–4.0)
MCH: 26.1 pg (ref 26.0–34.0)
MCHC: 31.7 g/dL (ref 30.0–36.0)
MCV: 82.6 fL (ref 80.0–100.0)
Monocytes Absolute: 0.4 10*3/uL (ref 0.1–1.0)
Monocytes Relative: 7 %
Neutro Abs: 3.1 10*3/uL (ref 1.7–7.7)
Neutrophils Relative %: 58 %
Platelets: 373 10*3/uL (ref 150–400)
RBC: 5.05 MIL/uL (ref 4.22–5.81)
RDW: 13.8 % (ref 11.5–15.5)
WBC: 5.4 10*3/uL (ref 4.0–10.5)
nRBC: 0 % (ref 0.0–0.2)

## 2019-12-11 LAB — COMPREHENSIVE METABOLIC PANEL
ALT: 47 U/L — ABNORMAL HIGH (ref 0–44)
AST: 34 U/L (ref 15–41)
Albumin: 4.4 g/dL (ref 3.5–5.0)
Alkaline Phosphatase: 89 U/L (ref 38–126)
Anion gap: 9 (ref 5–15)
BUN: 11 mg/dL (ref 6–20)
CO2: 26 mmol/L (ref 22–32)
Calcium: 9.3 mg/dL (ref 8.9–10.3)
Chloride: 103 mmol/L (ref 98–111)
Creatinine, Ser: 0.68 mg/dL (ref 0.61–1.24)
GFR calc Af Amer: >60 mL/min (ref >60)
GFR calc non Af Amer: >60 mL/min (ref >60)
Glucose, Bld: 124 mg/dL — ABNORMAL HIGH (ref 70–99)
Potassium: 4.6 mmol/L (ref 3.5–5.1)
Sodium: 138 mmol/L (ref 135–145)
Total Bilirubin: 0.6 mg/dL (ref 0.3–1.2)
Total Protein: 7.9 g/dL (ref 6.5–8.1)

## 2019-12-11 LAB — FERRITIN: Ferritin: 16 ng/mL — ABNORMAL LOW (ref 24–336)

## 2019-12-11 LAB — APTT: aPTT: 32 seconds (ref 24–36)

## 2019-12-11 NOTE — Progress Notes (Signed)
Hematology/Oncology Consult note [facility] Telephone:([phone] [phone]   Patient Care Team: Tracie Harrier, MD as PCP - General (Internal Medicine)  REFERRING PROVIDER: Efrain Sella, MD CHIEF COMPLAINTS/REASON FOR VISIT:  Evaluation of iron deficiency anemia  HISTORY OF PRESENTING ILLNESS:  Dalton Cole is a  44 y.o.  male with PMH listed below was seen in consultation at the request of Efrain Sella, MD   for evaluation of iron deficiency anemia.  Patient has a history of ulcerative colitis, and he follows up with Dr. Alice Reichert. Patient has been doing well from gastroenterology standpoint view with no abdominal pain, diarrhea, rectal bleeding, weight loss. Takes mesalamine Patient has a history of iron deficiency anemia.  Referred to establish care. Reviewed patient's recent labs done at [facility] clinic via care everywhere 09/02/2019 labs revealed anemia with hemoglobin of 13.3, MCV 82.4 Most recent iron panel was done on 04/11/2018 with a ferritin of 7. Patient reports taking iron tablets 3-4 times per week.  Tolerating oral iron supplementation with no significant GI toxicities.  Reviewed patient's previous labs ordered by primary care physician's office, anemia is chronic onset , duration is since at least 2016. No aggravating or improving factors.  Associated signs and symptoms: Denies fatigue.  Denies SOB with exertion.  Denies weight loss,  hematochezia, hemoptysis, hematuria.  Positive for easy bruising, no bleeding events. Context:  History of iron deficiency: Yes Rectal bleeding: Denies Hematemesis or hemoptysis : denies Blood in urine : denies   Last endoscopy: 03/06/2018 colonoscopy showed internal hemorrhoids.  The colon looked good No specimen was collected.     Review of Systems  Constitutional: Negative for appetite change, chills, fatigue, fever and unexpected weight change.  HENT:   Negative for  hearing loss and voice change.   Eyes: Negative for eye problems and icterus.  Respiratory: Negative for chest tightness, cough and shortness of breath.   Cardiovascular: Negative for chest pain and leg swelling.  Gastrointestinal: Negative for abdominal distention and abdominal pain.  Endocrine: Negative for hot flashes.  Genitourinary: Negative for difficulty urinating, dysuria and frequency.   Musculoskeletal: Negative for arthralgias.  Skin: Negative for itching and rash.  Neurological: Negative for light-headedness and numbness.  Hematological: Negative for adenopathy. Bruises/bleeds easily.  Psychiatric/Behavioral: Negative for confusion.    MEDICAL HISTORY:  Past Medical History:  Diagnosis Date  . Diabetes mellitus without complication ([place]) 7494  . Elevated liver enzymes   . Fatty liver   . GERD (gastroesophageal reflux disease)   . Heart murmur   . Hepatomegaly   . Hyperlipemia     SURGICAL HISTORY: Past Surgical History:  Procedure Laterality Date  . COLONOSCOPY  2013?   bloody stool/[place]  . COLONOSCOPY WITH PROPOFOL N/A 09/13/2017   Procedure: COLONOSCOPY WITH PROPOFOL;  Surgeon: Robert Bellow, MD;  Location: [facility] ENDOSCOPY;  Service: Endoscopy;  Laterality: N/A;  . COLONOSCOPY WITH PROPOFOL N/A 03/06/2018   Procedure: COLONOSCOPY WITH PROPOFOL;  Surgeon: Manya Silvas, MD;  Location: [facility] ENDOSCOPY;  Service: Endoscopy;  Laterality: N/A;  . EAR CANALOPLASTY    . ESOPHAGOGASTRODUODENOSCOPY (EGD) WITH PROPOFOL N/A 09/13/2017   Procedure: ESOPHAGOGASTRODUODENOSCOPY (EGD) WITH PROPOFOL;  Surgeon: Robert Bellow, MD;  Location: [facility] ENDOSCOPY;  Service: Endoscopy;  Laterality: N/A;  . IR FIBRIN GLUE REPAIR ANAL FISTULA    . NASAL SEPTUM SURGERY      SOCIAL HISTORY: Social History   Socioeconomic History  . Marital status: Married    Spouse name: Not  on file  . Number of children: 0  . Years of education: Not on file  . Highest education level:  Not on file  Occupational History  . Occupation: [employer] photographer  Tobacco Use  . Smoking status: Never Smoker  . Smokeless tobacco: Never Used  Substance and Sexual Activity  . Alcohol use: Yes    Alcohol/week: 0.0 standard drinks    Comment: OCCASIONALLY  . Drug use: No  . Sexual activity: Yes  Other Topics Concern  . Not on file  Social History Narrative   [employer], married, no children   Social Determinants of Health   Financial Resource Strain:   . Difficulty of Paying Living Expenses: Not on file  Food Insecurity:   . Worried About Charity fundraiser in the Last Year: Not on file  . Ran Out of Food in the Last Year: Not on file  Transportation Needs:   . Lack of Transportation (Medical): Not on file  . Lack of Transportation (Non-Medical): Not on file  Physical Activity:   . Days of Exercise per Week: Not on file  . Minutes of Exercise per Session: Not on file  Stress:   . Feeling of Stress : Not on file  Social Connections:   . Frequency of Communication with Friends and Family: Not on file  . Frequency of Social Gatherings with Friends and Family: Not on file  . Attends Religious Services: Not on file  . Active Member of Clubs or Organizations: Not on file  . Attends Archivist Meetings: Not on file  . Marital Status: Not on file  Intimate Partner Violence:   . Fear of Current or Ex-Partner: Not on file  . Emotionally Abused: Not on file  . Physically Abused: Not on file  . Sexually Abused: Not on file    FAMILY HISTORY: Family History  Problem Relation Age of Onset  . Heart disease Mother   . Stroke Mother   . Diabetes Father   . Migraines Sister   . Colon cancer Neg Hx   . Liver disease Neg Hx     ALLERGIES:  is allergic to contrast media [iodinated diagnostic agents] and metrizamide.  MEDICATIONS:  Current Outpatient Medications  Medication Sig Dispense Refill  . aspirin EC 81 MG tablet Take 81 mg by mouth daily.    Marland Kitchen atorvastatin  (LIPITOR) 10 MG tablet Take 10 mg by mouth 2 (two) times a week.    . cyanocobalamin 1000 MCG tablet Take 1,000 mcg by mouth daily.    Marland Kitchen lisinopril (ZESTRIL) 2.5 MG tablet Take 1 tablet (2.5 mg total) by mouth daily. 90 tablet 1  . mesalamine (LIALDA) 1.2 g EC tablet Take 1.2 g by mouth daily with breakfast.    . Multiple Vitamin (MULTI-VITAMINS) TABS Take by mouth.    . pantoprazole (PROTONIX) 40 MG tablet Take 1 tablet (40 mg total) by mouth daily. 90 tablet 0  . pioglitazone (ACTOS) 30 MG tablet Take 30 mg by mouth daily.    . sitaGLIPtin-metformin (JANUMET) 50-1000 MG tablet Take 1 tablet by mouth 2 (two) times daily with a meal.     . polyethylene glycol powder (GLYCOLAX/MIRALAX) powder 255 grams one bottle for colonoscopy prep (Patient not taking: Reported on 12/10/2019) 255 g 0  . predniSONE (DELTASONE) 10 MG tablet Take 1 tablet (10 mg total) by mouth daily. Day 1-2: Take 50 mg  ( 5 pills) Day 3-4 : Take 40 mg (4pills) Day 5-6: Take 30 mg (  3 pills) Day 7-8:  Take 20 mg (2 pills) Day 9:  Take 82m (1 pill) (Patient not taking: Reported on 12/10/2019) 29 tablet 0   No current facility-administered medications for this visit.     PHYSICAL EXAMINATION: ECOG PERFORMANCE STATUS: 0 - Asymptomatic Vitals:   12/11/19 0940  BP: 120/80  Pulse: 81  Resp: 16  Temp: (!) 97.4 F (36.3 C)   Filed Weights   12/11/19 0940  Weight: 168 lb (76.2 kg)    Physical Exam Constitutional:      General: He is not in acute distress. HENT:     Head: Normocephalic and atraumatic.  Eyes:     General: No scleral icterus.    Pupils: Pupils are equal, round, and reactive to light.  Cardiovascular:     Rate and Rhythm: Normal rate and regular rhythm.     Heart sounds: Normal heart sounds.  Pulmonary:     Effort: Pulmonary effort is normal. No respiratory distress.     Breath sounds: No wheezing.  Abdominal:     General: Bowel sounds are normal. There is no distension.     Palpations: Abdomen is  soft. There is no mass.     Tenderness: There is no abdominal tenderness.  Musculoskeletal:        General: No deformity. Normal range of motion.     Cervical back: Normal range of motion and neck supple.  Skin:    General: Skin is warm and dry.     Findings: No erythema or rash.  Neurological:     Mental Status: He is alert and oriented to person, place, and time.     Cranial Nerves: No cranial nerve deficit.     Coordination: Coordination normal.  Psychiatric:        Behavior: Behavior normal.        Thought Content: Thought content normal.       CMP Latest Ref Rng & Units 12/11/2019  Glucose 70 - 99 mg/dL 124(H)  BUN 6 - 20 mg/dL 11  Creatinine 0.61 - 1.24 mg/dL 0.68  Sodium 135 - 145 mmol/L 138  Potassium 3.5 - 5.1 mmol/L 4.6  Chloride 98 - 111 mmol/L 103  CO2 22 - 32 mmol/L 26  Calcium 8.9 - 10.3 mg/dL 9.3  Total Protein 6.5 - 8.1 g/dL 7.9  Total Bilirubin 0.3 - 1.2 mg/dL 0.6  Alkaline Phos 38 - 126 U/L 89  AST 15 - 41 U/L 34  ALT 0 - 44 U/L 47(H)   CBC Latest Ref Rng & Units 12/11/2019  WBC 4.0 - 10.5 K/uL 5.4  Hemoglobin 13.0 - 17.0 g/dL 13.2  Hematocrit 39.0 - 52.0 % 41.7  Platelets 150 - 400 K/uL 373     LABORATORY DATA:  I have reviewed the data as listed Lab Results  Component Value Date   WBC 5.4 12/11/2019   HGB 13.2 12/11/2019   HCT 41.7 12/11/2019   MCV 82.6 12/11/2019   PLT 373 12/11/2019   Recent Labs    12/11/19 1134  NA 138  K 4.6  CL 103  CO2 26  GLUCOSE 124*  BUN 11  CREATININE 0.68  CALCIUM 9.3  GFRNONAA >60  GFRAA >60  PROT 7.9  ALBUMIN 4.4  AST 34  ALT 47*  ALKPHOS 89  BILITOT 0.6   Iron/TIBC/Ferritin/ %Sat    Component Value Date/Time   IRON 59 12/11/2019 1134   IRON 83 06/09/2015 1014   TIBC 536 (H) 12/11/2019 1134   TIBC  489 (H) 06/09/2015 1014   FERRITIN 16 (L) 12/11/2019 1134   FERRITIN 26 (L) 06/09/2015 1014   IRONPCTSAT 11 (L) 12/11/2019 1134   IRONPCTSAT 17 06/09/2015 1014     No results  found.    ASSESSMENT & PLAN:  1. Iron deficiency anemia due to chronic blood loss   2. Other ulcerative colitis without complication (HCC)   3. Easy bruising    His previous labs were reviewed and discussed with patient.  He has mild anemia.  No recent iron panel. I recommend to check CBC, iron TIBC ferritin.  Check CMP Option of IV iron with Venofer 281m was discussed with patient if repeat blood work shows persistent iron deficiency despite taking oral iron supplementation.. Allergy reactions/infusion reaction including anaphylactic reaction discussed with patient. Other side effects include but not limited to high blood pressure, skin rash, weight gain, leg swelling, etc. Patient voices understanding and willing to proceed if needed.  Today's blood work results are reviewed after clinic and discussed with patient over the phone. Given that patient has already taken oral iron supplementation, still have functional iron deficiency with normal hemoglobin, I recommend IV Venofer 200 mgx 1 -2 doses.  I anticipate he will need IV iron periodically given chronic blood loss from ulcerative colitis. Other option will be to increase oral iron supplementation to daily and repeat blood work in 6 to 8 weeks. Patient would like to discuss with his family members and update me.  #Easy bruising, check PT PTT.  Results were reviewed.  Normal.  Patient also has normal platelet counts. If this continues to be a severe issue, will check platelet function assay.  Orders Placed This Encounter  Procedures  . CBC with Differential    Standing Status:   Future    Number of Occurrences:   1    Standing Expiration Date:   12/10/2020  . Comprehensive metabolic panel    Standing Status:   Future    Number of Occurrences:   1    Standing Expiration Date:   12/10/2020  . Iron and TIBC    Standing Status:   Future    Number of Occurrences:   1    Standing Expiration Date:   12/10/2020  . Ferritin    Standing  Status:   Future    Number of Occurrences:   1    Standing Expiration Date:   12/10/2020  . APTT    Standing Status:   Future    Number of Occurrences:   1    Standing Expiration Date:   12/10/2020  . Protime-INR    Standing Status:   Future    Number of Occurrences:   1    Standing Expiration Date:   12/10/2020  . Copper, serum    Standing Status:   Future    Number of Occurrences:   1    Standing Expiration Date:   12/10/2020    All questions were answered. The patient knows to call the clinic with any problems questions or concerns.  Cc TMasthope TBenay Pike MD  Return of visit: To be determined Thank you for this kind referral and the opportunity to participate in the care of this patient. A copy of today's note is routed to referring provider     ZEarlie Server MD, PhD Hematology Oncology [facility] ABeaver Dam Com HsptlPager- [phone]/05/2020

## 2019-12-14 LAB — COPPER, SERUM: Copper: 100 ug/dL (ref 69–132)

## 2020-06-10 ENCOUNTER — Other Ambulatory Visit: Payer: Self-pay

## 2020-06-10 ENCOUNTER — Ambulatory Visit
Admission: RE | Admit: 2020-06-10 | Discharge: 2020-06-10 | Disposition: A | Discharge status: Home / Self Care | Payer: No Typology Code available for payment source | Source: Ambulatory Visit | Attending: Emergency Medicine | Admitting: Emergency Medicine

## 2020-06-10 VITALS — BP 120/78 | HR 92 | Temp 99.0°F | Resp 18

## 2020-06-10 DIAGNOSIS — B349 Viral infection, unspecified: Secondary | ICD-10-CM | POA: Insufficient documentation

## 2020-06-10 HISTORY — DX: Age-related osteoporosis without current pathological fracture: M81.0

## 2020-06-10 LAB — POCT RAPID STREP A (OFFICE): Rapid Strep A Screen: NEGATIVE

## 2020-06-10 MED ORDER — MUCINEX 600 MG PO TB12: 1200.0000 mg | ORAL_TABLET | Freq: Two times a day (BID) | ORAL | 0 refills | Status: AC | PRN

## 2020-06-10 MED ORDER — IBUPROFEN 600 MG PO TABS: 600.0000 mg | ORAL_TABLET | Freq: Four times a day (QID) | ORAL | 0 refills | Status: AC | PRN

## 2020-06-10 MED ORDER — BENZONATATE 100 MG PO CAPS: 100.0000 mg | ORAL_CAPSULE | Freq: Three times a day (TID) | ORAL | 0 refills | Status: AC | PRN

## 2020-06-10 NOTE — ED Triage Notes (Signed)
Patient complains of cough, headaches, nasal and chest congestion, and chills at night. Reports he got a rapid COVID test yesterday at [facility]; reports it was negative.

## 2020-06-10 NOTE — Discharge Instructions (Addendum)
Take the ibuprofen, Mucinex, and Tessalon Perles as directed.  Follow up with your primary care provider if your symptoms are not improving.    Your rapid strep test is negative.  A throat culture is pending; we will call you if it is positive requiring treatment.    Your COVID test is pending.  You should self quarantine until the test result is back.    Go to the emergency department if you develop shortness of breath, severe diarrhea, high fever not relieved by Tylenol or ibuprofen, or other concerning symptoms.

## 2020-06-10 NOTE — ED Provider Notes (Signed)
[facility] VIDEO VISITS    CSN: [id number] Arrival date & time: 06/10/20  1150      History   Chief Complaint Chief Complaint  Patient presents with   Cough   Nasal Congestion   Chills   Sore Throat    HPI Dalton Cole is a 44 y.o. male.   Patient presents with nonproductive cough, mild SOB, and sore throat x 1 day.  He reports headache, nasal congestion, chest congestion, chills x 5-6 days.  He vomited once this morning when he was coughing.  He had a rapid COVID test done yesterday at [facility] which was negative.  He denies fever, rash, diarrhea, or other symptoms.  Treatment attempted at home with sore throat lozenge.  The history is provided by the patient.    Past Medical History:  Diagnosis Date   Diabetes mellitus without complication ([place]) 4098   Elevated liver enzymes    Fatty liver    GERD (gastroesophageal reflux disease)    Heart murmur    Hepatomegaly    Hyperlipemia    Osteoporosis     Patient Active Problem List   Diagnosis Date Noted   Heart murmur 12/10/2019   Ulcerative colitis without complications ([place]) 11/91/4782   Diarrhea 07/14/2017   Right lower quadrant abdominal pain 07/14/2017   SIRS (systemic inflammatory response syndrome) ([place]) 01/19/2017   Low serum vitamin B12 01/02/2017   Moderate obstructive sleep apnea 09/12/2016   Redundant foreskin 04/27/2016   Type 2 diabetes mellitus without complication, without long-term current use of insulin ([place]) 03/24/2016   Diabetes mellitus type 2, uncontrolled ([place]) 06/12/2015   Elevated liver function tests 06/05/2015   Hepatic steatosis 06/05/2015   Hyperlipidemia 06/02/2015   Elevated liver enzymes 06/02/2015   Elevated serum GGT level 06/02/2015   GERD (gastroesophageal reflux disease) 06/02/2015    Past Surgical History:  Procedure Laterality Date   COLONOSCOPY  2013?   bloody stool/[place]   COLONOSCOPY WITH PROPOFOL N/A 09/13/2017   Procedure:  COLONOSCOPY WITH PROPOFOL;  Surgeon: Robert Bellow, MD;  Location: [facility] ENDOSCOPY;  Service: Endoscopy;  Laterality: N/A;   COLONOSCOPY WITH PROPOFOL N/A 03/06/2018   Procedure: COLONOSCOPY WITH PROPOFOL;  Surgeon: Manya Silvas, MD;  Location: [facility] ENDOSCOPY;  Service: Endoscopy;  Laterality: N/A;   EAR CANALOPLASTY     ESOPHAGOGASTRODUODENOSCOPY (EGD) WITH PROPOFOL N/A 09/13/2017   Procedure: ESOPHAGOGASTRODUODENOSCOPY (EGD) WITH PROPOFOL;  Surgeon: Robert Bellow, MD;  Location: [facility] ENDOSCOPY;  Service: Endoscopy;  Laterality: N/A;   IR FIBRIN GLUE REPAIR ANAL FISTULA     NASAL SEPTUM SURGERY         Home Medications    Prior to Admission medications   Medication Sig Start Date End Date Taking? Authorizing Provider  aspirin EC 81 MG tablet Take 81 mg by mouth daily.    [provider]  atorvastatin (LIPITOR) 10 MG tablet Take 10 mg by mouth 2 (two) times a week.    [provider]  benzonatate (TESSALON) 100 MG capsule Take 1 capsule (100 mg total) by mouth 3 (three) times daily as needed for cough. 06/10/20   Sharion Balloon, NP  cyanocobalamin 1000 MCG tablet Take 1,000 mcg by mouth daily.    [provider]  guaiFENesin (MUCINEX) 600 MG 12 hr tablet Take 2 tablets (1,200 mg total) by mouth 2 (two) times daily as needed. 06/10/20   Sharion Balloon, NP  ibuprofen (ADVIL) 600 MG tablet Take 1 tablet (600 mg total) by mouth every  6 (six) hours as needed. 06/10/20   Sharion Balloon, NP  lisinopril (ZESTRIL) 2.5 MG tablet Take 1 tablet (2.5 mg total) by mouth daily. 06/12/15   Roselee Nova, MD  mesalamine (LIALDA) 1.2 g EC tablet Take 1.2 g by mouth daily with breakfast.    [provider]  Multiple Vitamin (MULTI-VITAMINS) TABS Take by mouth.    [provider]  pantoprazole (PROTONIX) 40 MG tablet Take 1 tablet (40 mg total) by mouth daily. 06/02/15   Roselee Nova, MD  pioglitazone (ACTOS) 30 MG tablet Take 30 mg by mouth daily.     [provider]  polyethylene glycol powder (GLYCOLAX/MIRALAX) powder 255 grams one bottle for colonoscopy prep Patient not taking: Reported on 12/10/2019 07/13/17   Robert Bellow, MD  predniSONE (DELTASONE) 10 MG tablet Take 1 tablet (10 mg total) by mouth daily. Day 1-2: Take 50 mg  ( 5 pills) Day 3-4 : Take 40 mg (4pills) Day 5-6: Take 30 mg (3 pills) Day 7-8:  Take 20 mg (2 pills) Day 9:  Take 11m (1 pill) Patient not taking: Reported on 12/10/2019 07/24/17   RMerlyn Lot MD  sitaGLIPtin-metformin (JANUMET) 50-1000 MG tablet Take 1 tablet by mouth 2 (two) times daily with a meal.  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 Negative for fever.  HENT: Positive for congestion, postnasal drip, rhinorrhea and sore throat. Negative for ear pain.   Eyes: Negative for pain and visual disturbance.  Respiratory: Positive for cough and shortness of breath.   Cardiovascular: Negative for chest pain and palpitations.  Gastrointestinal: Positive for vomiting. Negative for abdominal pain and diarrhea.  Genitourinary: Negative for dysuria and hematuria.  Musculoskeletal: Negative for arthralgias and back pain.  Skin: Negative for color change and rash.  Neurological: Positive for headaches. Negative for seizures, syncope, weakness and numbness.  All  other systems reviewed and are negative.    Physical Exam Triage Vital Signs ED Triage Vitals  Enc Vitals Group     BP 06/10/20 1154 120/78     Pulse Rate 06/10/20 1154 92     Resp 06/10/20 1154 18     Temp 06/10/20 1154 99 F (37.2 C)     Temp src --      SpO2 06/10/20 1154 96 %     Weight --      Height --      Head Circumference --      Peak Flow --      Pain Score 06/10/20 1151 8     Pain Loc --      Pain Edu? --      Excl. in [place] --    No data found.  Updated  Vital Signs BP 120/78    Pulse 92    Temp 99 F (37.2 C)    Resp 18    SpO2 96%   Visual Acuity Right Eye Distance:   Left Eye Distance:   Bilateral Distance:    Right Eye Near:   Left Eye Near:    Bilateral Near:     Physical Exam Vitals and nursing note reviewed.  Constitutional:      General: He is not in acute distress.    Appearance: He is well-developed. He is not ill-appearing.  HENT:     Head: Normocephalic and atraumatic.     Right Ear: Tympanic membrane normal.     Left Ear: Tympanic membrane normal.     Nose: Congestion present.     Mouth/Throat:     Mouth: Mucous membranes are moist.     Pharynx: Posterior oropharyngeal erythema present. No oropharyngeal exudate.  Eyes:     Conjunctiva/sclera: Conjunctivae normal.  Cardiovascular:     Rate and Rhythm: Normal rate and regular rhythm.     Heart sounds: No murmur heard.   Pulmonary:     Effort: Pulmonary effort is normal. No respiratory distress.     Breath sounds: Normal breath sounds. No wheezing or rhonchi.  Abdominal:     Palpations: Abdomen is soft.     Tenderness: There is no abdominal tenderness. There is no guarding or rebound.  Musculoskeletal:     Cervical back: Neck supple.  Skin:    General: Skin is warm and dry.     Findings: No rash.  Neurological:     General: No focal deficit present.     Mental Status: He is alert and oriented to person, place, and time.     Gait: Gait normal.  Psychiatric:        Mood and  Affect: Mood normal.        Behavior: Behavior normal.      UC Treatments / Results  Labs (all labs ordered are listed, but only abnormal results are displayed) Labs Reviewed  NOVEL CORONAVIRUS, NAA  CULTURE, GROUP A STREP [facility])  POCT RAPID STREP A (OFFICE)    EKG   Radiology No results found.  Procedures Procedures (including critical care time)  Medications Ordered in UC Medications - No data to display  Initial Impression / Assessment and Plan / UC Course  I have reviewed the triage vital signs and the nursing notes.  Pertinent labs & imaging results that were available during my care of the patient were reviewed by me and considered in my medical decision making (see chart for details).   Viral illness.  Treating with ibuprofen, Mucinex, Tessalon Perles.  Instructed patient to follow-up with his PCP if his symptoms are not improving.  Rapid strep negative; throat culture pending.  PCR COVID test performed here.  Instructed patient to self quarantine until the test result is back.  Instructed patient to go to the emergency department if he develops high fever, shortness of breath, severe diarrhea, or other concerning symptoms.  Patient agrees with plan of care.    Final Clinical Impressions(s) / UC Diagnoses   Final diagnoses:  Viral illness     Discharge Instructions     Take the ibuprofen, Mucinex, and Tessalon Perles as directed.  Follow up with your primary care provider if your symptoms are not improving.    Your rapid strep test is negative.  A throat culture is pending; we will call you if it is  positive requiring treatment.    Your COVID test is pending.  You should self quarantine until the test result is back.    Go to the emergency department if you develop shortness of breath, severe diarrhea, high fever not relieved by Tylenol or ibuprofen, or other concerning symptoms.          ED Prescriptions    Medication Sig Dispense Auth. Provider    ibuprofen (ADVIL) 600 MG tablet Take 1 tablet (600 mg total) by mouth every 6 (six) hours as needed. 30 tablet Sharion Balloon, NP   guaiFENesin (MUCINEX) 600 MG 12 hr tablet Take 2 tablets (1,200 mg total) by mouth 2 (two) times daily as needed. 12 tablet Sharion Balloon, NP   benzonatate (TESSALON) 100 MG capsule Take 1 capsule (100 mg total) by mouth 3 (three) times daily as needed for cough. 21 capsule Sharion Balloon, NP     PDMP not reviewed this encounter.   Sharion Balloon, NP 06/10/20 1248

## 2020-06-11 LAB — SARS-COV-2, NAA 2 DAY TAT

## 2020-06-11 LAB — NOVEL CORONAVIRUS, NAA: SARS-CoV-2, NAA: NOT DETECTED

## 2020-06-12 LAB — CULTURE, GROUP A STREP (THRC)

## 2020-07-17 ENCOUNTER — Other Ambulatory Visit: Payer: Self-pay | Admitting: Internal Medicine

## 2020-08-13 ENCOUNTER — Other Ambulatory Visit: Payer: Self-pay | Admitting: Unknown Physician Specialty

## 2020-08-13 DIAGNOSIS — H9210 Otorrhea, unspecified ear: Secondary | ICD-10-CM

## 2020-08-25 ENCOUNTER — Ambulatory Visit: Payer: No Typology Code available for payment source

## 2020-08-31 ENCOUNTER — Ambulatory Visit: Payer: No Typology Code available for payment source

## 2020-09-04 ENCOUNTER — Ambulatory Visit
Admission: RE | Admit: 2020-09-04 | Discharge: 2020-09-04 | Disposition: A | Discharge status: Home / Self Care | Payer: No Typology Code available for payment source | Source: Ambulatory Visit | Attending: Unknown Physician Specialty | Admitting: Unknown Physician Specialty

## 2020-09-04 ENCOUNTER — Other Ambulatory Visit: Payer: Self-pay

## 2020-09-04 DIAGNOSIS — H9210 Otorrhea, unspecified ear: Secondary | ICD-10-CM | POA: Diagnosis not present

## 2020-10-02 ENCOUNTER — Other Ambulatory Visit: Payer: Self-pay | Admitting: Internal Medicine

## 2020-11-16 ENCOUNTER — Other Ambulatory Visit: Payer: Self-pay | Admitting: Internal Medicine

## 2020-11-27 IMAGING — CT CT TEMPORAL BONES W/O CM
2 of 5 series · 14 of 40 positions shown, 17 images · non-contrast
Comparison: 10/22/2018

CLINICAL DATA: Bloody right ear discharge for 3-4 months.

EXAM:
CT TEMPORAL BONES WITHOUT CONTRAST
TECHNIQUE: Axial and coronal plane CT imaging of the petrous temporal bones was
performed with thin-collimation image reconstruction. No intravenous
contrast was administered. Multiplanar CT image reconstructions were
also generated.

[Series 10: cor mag right temperal bones 0.80 cor · coronal · 0.13mm/px · 2 of 125 slices shown]
[im 42/125  bone]
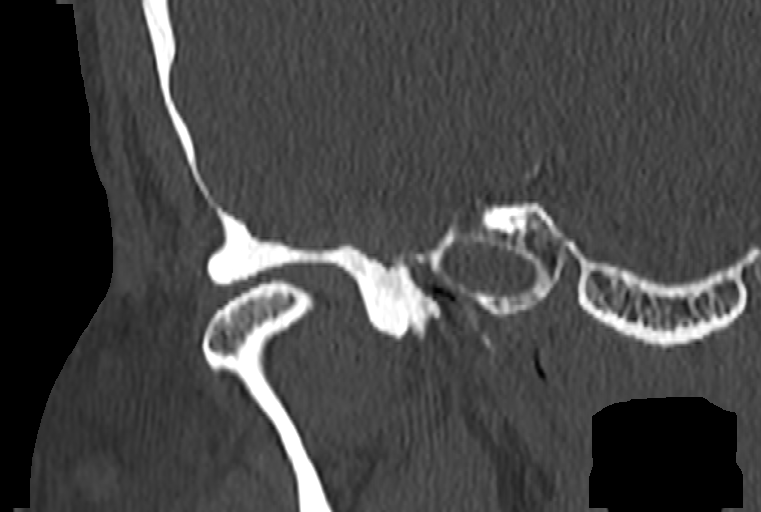
[im 83/125  bone]
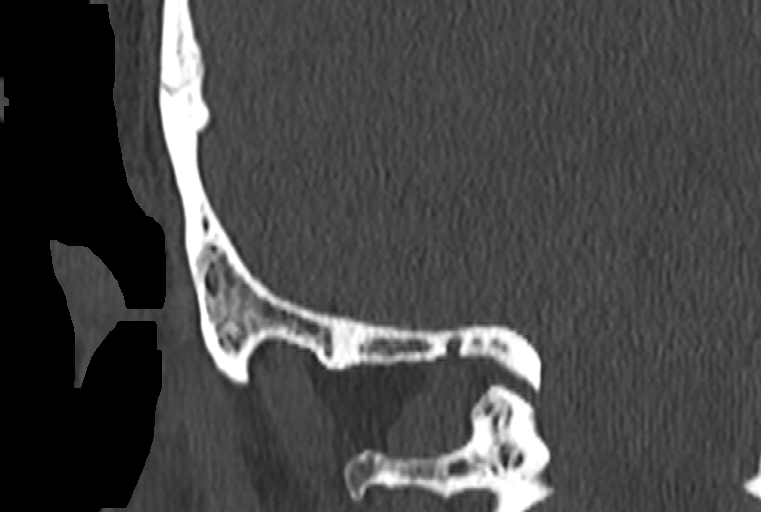

[Series 12: ax mag left temperal bones 0.60 · axial · 0.20mm/px · z∈[-567,-507]mm · 12 of 112 slices shown, 15 images]
[im 6/112  brain]
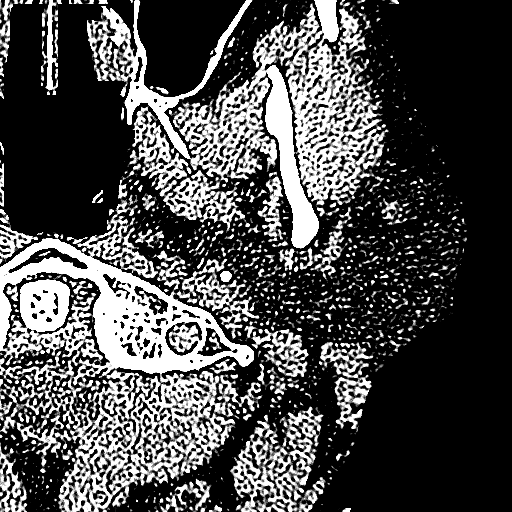
[im 6/112  bone]
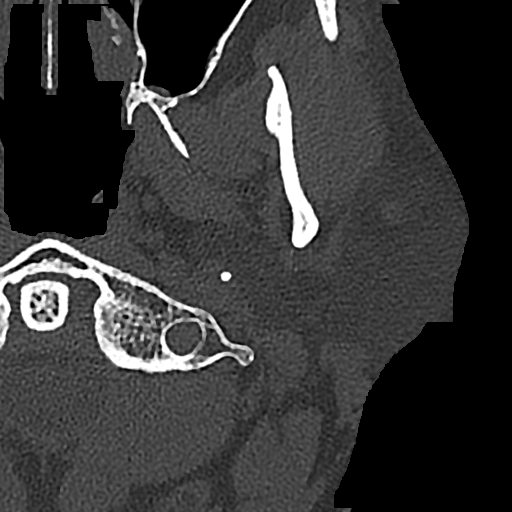
[im 16/112  bone]
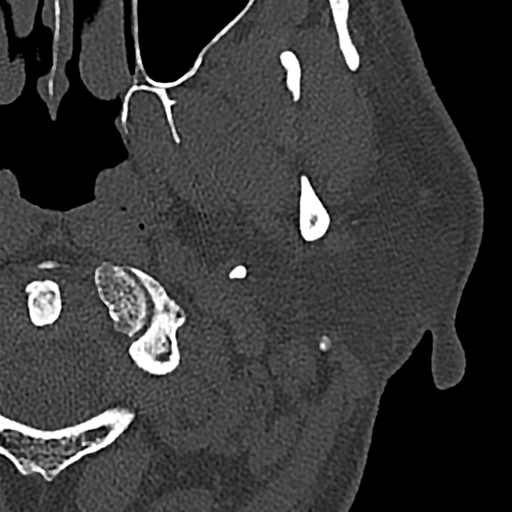
[im 26/112  bone]
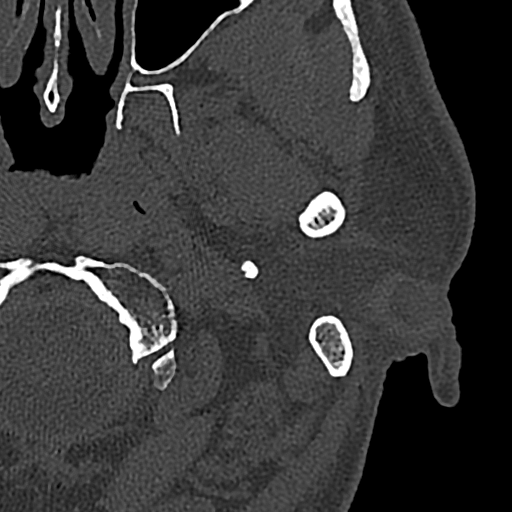
[im 36/112  bone]
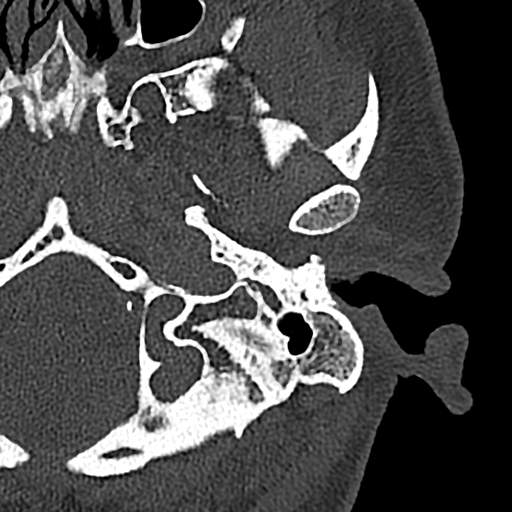
[im 41/112  brain]
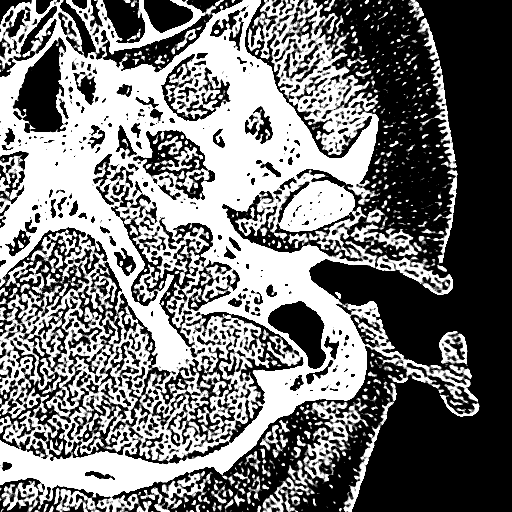
[im 41/112  bone]
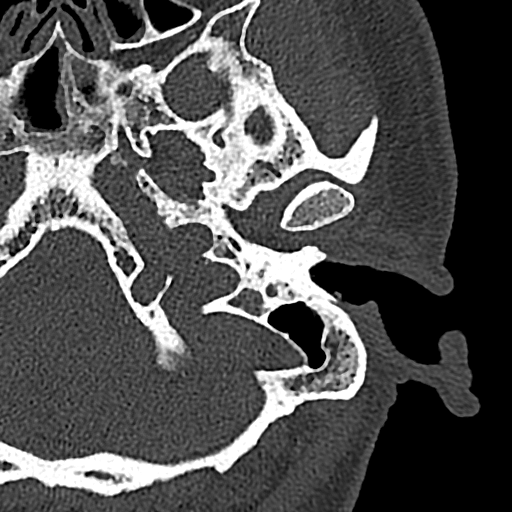
[im 51/112  bone]
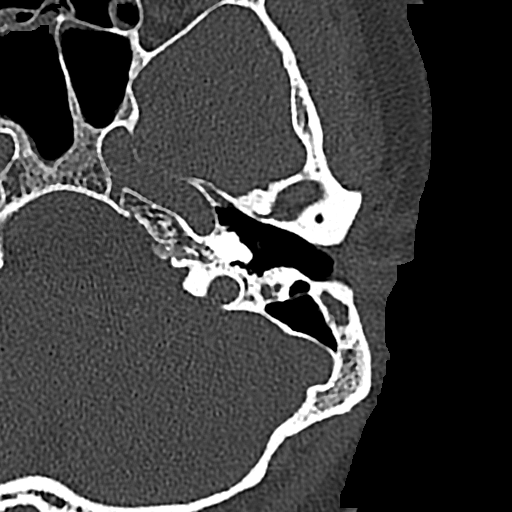
[im 61/112  bone]
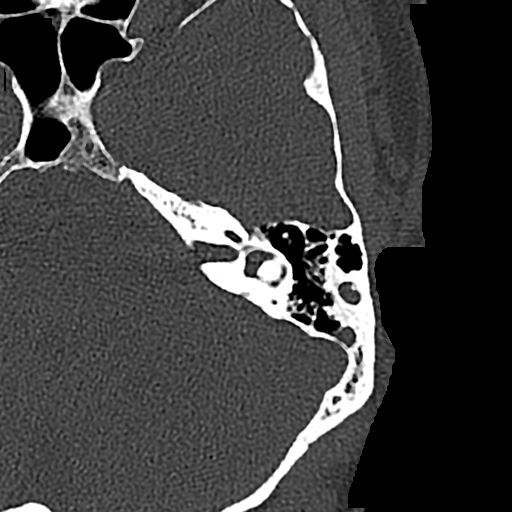
[im 71/112  bone]
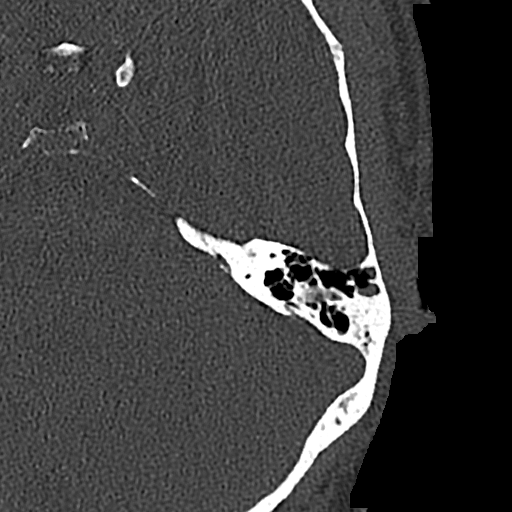
[im 76/112  brain]
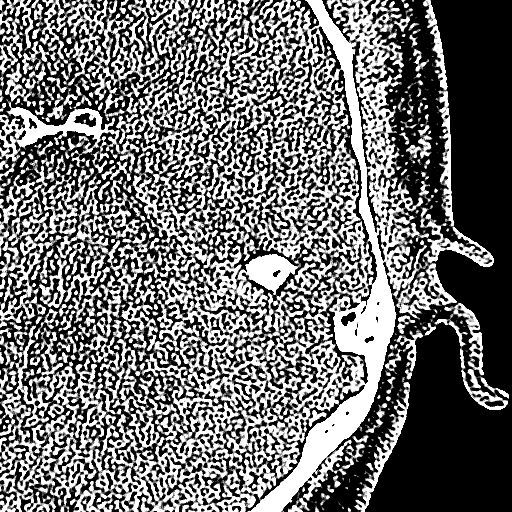
[im 76/112  bone]
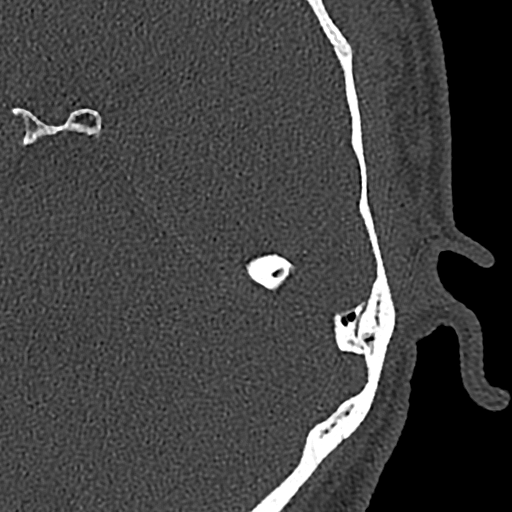
[im 86/112  bone]
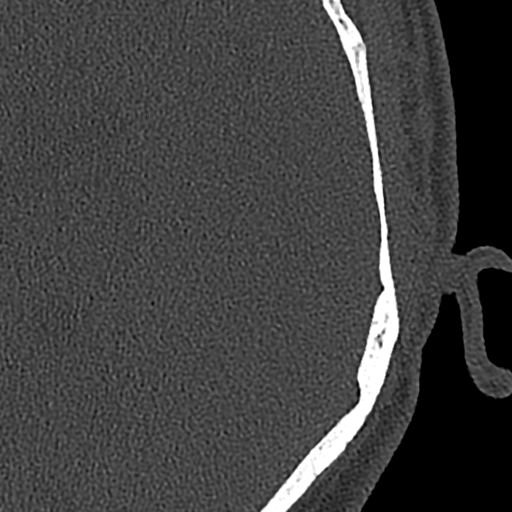
[im 96/112  bone]
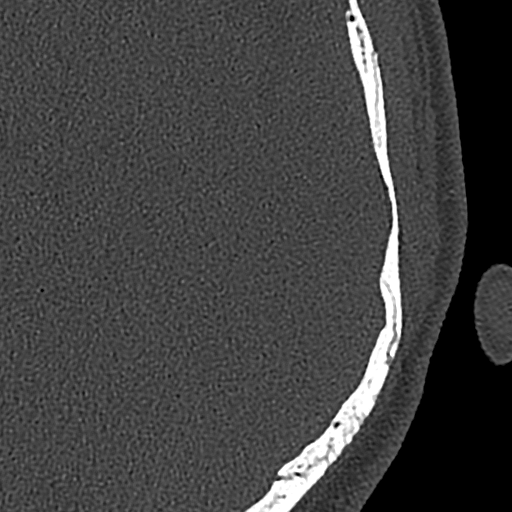
[im 106/112  bone]
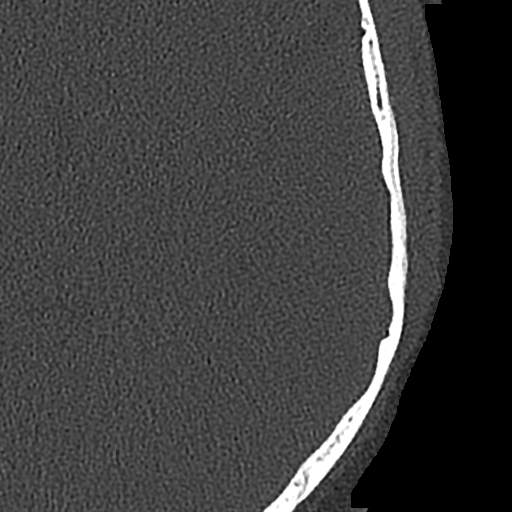

[14 of 40 positions shown; findings below may reference images not displayed]

FINDINGS: RIGHT: A defect is again seen within the mastoid bone with lateral
dehiscence which may be postsurgical or related to a previous
cholesteatoma, infection, or tumor. Compared to the prior CT, there
is progressive, now complete soft tissue opacification of the
mastoid cavity without interval osseous erosion. This soft tissue
extends into the epitympanum and superomedial aspect of the external
auditory canal. Soft tissue contacts the malleus and incus which are
chronically eroded without evidence of progressive interval erosion.
The stapes appears intact. There is minimal soft tissue in the
hypotympanum. The internal auditory canal, cochlea, vestibule, and
semicircular canals are unremarkable.

LEFT: The external auditory canal is unremarkable. The ossicles
appear intact. The tympanic cavity is clear. The internal auditory
canal, cochlea, vestibule, and semicircular canals are unremarkable.
Opacification of several mastoid air cells laterally and inferiorly
with surrounding sclerosis is unchanged.

There is mild mucosal thickening in the included paranasal sinuses.
The visualized portion of the brain is unremarkable. Visualized
orbits are unremarkable.
IMPRESSION: 1. Progressive, complete soft tissue opacification of the right
mastoid cavity with soft tissue extending into the epitympanum and
medial EAC with chronic erosion of the malleus and incus. No
interval osseous erosion.
2. Unchanged small chronic left mastoid effusion.

## 2021-02-12 DIAGNOSIS — H7191 Unspecified cholesteatoma, right ear: Secondary | ICD-10-CM | POA: Diagnosis not present

## 2021-02-12 DIAGNOSIS — H6120 Impacted cerumen, unspecified ear: Secondary | ICD-10-CM | POA: Diagnosis not present

## 2021-02-12 DIAGNOSIS — E119 Type 2 diabetes mellitus without complications: Secondary | ICD-10-CM | POA: Diagnosis not present

## 2021-02-12 DIAGNOSIS — H90A11 Conductive hearing loss, unilateral, right ear with restricted hearing on the contralateral side: Secondary | ICD-10-CM | POA: Diagnosis not present

## 2021-02-12 DIAGNOSIS — H9211 Otorrhea, right ear: Secondary | ICD-10-CM | POA: Diagnosis not present

## 2021-02-12 DIAGNOSIS — Z7984 Long term (current) use of oral hypoglycemic drugs: Secondary | ICD-10-CM | POA: Diagnosis not present

## 2021-02-12 DIAGNOSIS — H6121 Impacted cerumen, right ear: Secondary | ICD-10-CM | POA: Diagnosis not present

## 2021-02-12 DIAGNOSIS — H9319 Tinnitus, unspecified ear: Secondary | ICD-10-CM | POA: Diagnosis not present

## 2021-02-12 DIAGNOSIS — H9210 Otorrhea, unspecified ear: Secondary | ICD-10-CM | POA: Diagnosis not present

## 2021-02-16 DIAGNOSIS — Z125 Encounter for screening for malignant neoplasm of prostate: Secondary | ICD-10-CM | POA: Diagnosis not present

## 2021-02-16 DIAGNOSIS — Z0000 Encounter for general adult medical examination without abnormal findings: Secondary | ICD-10-CM | POA: Diagnosis not present

## 2021-02-16 DIAGNOSIS — E785 Hyperlipidemia, unspecified: Secondary | ICD-10-CM | POA: Diagnosis not present

## 2021-02-16 DIAGNOSIS — Z79899 Other long term (current) drug therapy: Secondary | ICD-10-CM | POA: Diagnosis not present

## 2021-02-16 DIAGNOSIS — K219 Gastro-esophageal reflux disease without esophagitis: Secondary | ICD-10-CM | POA: Diagnosis not present

## 2021-02-16 DIAGNOSIS — K5100 Ulcerative (chronic) pancolitis without complications: Secondary | ICD-10-CM | POA: Diagnosis not present

## 2021-02-16 DIAGNOSIS — E119 Type 2 diabetes mellitus without complications: Secondary | ICD-10-CM | POA: Diagnosis not present

## 2021-02-16 DIAGNOSIS — D649 Anemia, unspecified: Secondary | ICD-10-CM | POA: Diagnosis not present

## 2021-02-18 DIAGNOSIS — K5100 Ulcerative (chronic) pancolitis without complications: Secondary | ICD-10-CM | POA: Diagnosis not present

## 2021-02-18 DIAGNOSIS — K219 Gastro-esophageal reflux disease without esophagitis: Secondary | ICD-10-CM | POA: Diagnosis not present

## 2021-02-18 DIAGNOSIS — E119 Type 2 diabetes mellitus without complications: Secondary | ICD-10-CM | POA: Diagnosis not present

## 2021-02-18 DIAGNOSIS — G4733 Obstructive sleep apnea (adult) (pediatric): Secondary | ICD-10-CM | POA: Diagnosis not present

## 2021-03-17 ENCOUNTER — Other Ambulatory Visit: Payer: Self-pay

## 2021-03-17 MED FILL — Mesalamine Tab Delayed Release 1.2 GM: ORAL | 30 days supply | Qty: 120 | Fill #0 | Status: AC

## 2021-03-18 ENCOUNTER — Other Ambulatory Visit: Payer: Self-pay

## 2021-03-19 ENCOUNTER — Other Ambulatory Visit: Payer: Self-pay

## 2021-03-23 ENCOUNTER — Other Ambulatory Visit: Payer: Self-pay

## 2021-03-24 ENCOUNTER — Other Ambulatory Visit: Payer: Self-pay

## 2021-03-24 MED ORDER — LISINOPRIL 2.5 MG PO TABS
2.5000 mg | ORAL_TABLET | Freq: Every day | ORAL | 1 refills | Status: DC
  Filled 2021-03-24: qty 90, 90d supply, fill #0
  Filled 2021-06-21: qty 90, 90d supply, fill #1

## 2021-03-24 MED ORDER — PANTOPRAZOLE SODIUM 40 MG PO TBEC
1.0000 | DELAYED_RELEASE_TABLET | Freq: Every day | ORAL | 1 refills | Status: DC
  Filled 2021-03-24: qty 90, 90d supply, fill #0
  Filled 2021-06-21: qty 90, 90d supply, fill #1

## 2021-03-24 MED ORDER — PIOGLITAZONE HCL 30 MG PO TABS
30.0000 mg | ORAL_TABLET | Freq: Every day | ORAL | 1 refills | Status: DC
  Filled 2021-03-24: qty 90, 90d supply, fill #0
  Filled 2021-06-21: qty 90, 90d supply, fill #1

## 2021-03-24 MED ORDER — ATORVASTATIN CALCIUM 10 MG PO TABS
1.0000 | ORAL_TABLET | Freq: Every day | ORAL | 0 refills | Status: DC
  Filled 2021-03-24 – 2021-06-21 (×2): qty 90, 90d supply, fill #0

## 2021-03-25 ENCOUNTER — Other Ambulatory Visit: Payer: Self-pay

## 2021-03-26 ENCOUNTER — Other Ambulatory Visit: Payer: Self-pay

## 2021-03-31 ENCOUNTER — Other Ambulatory Visit: Payer: No Typology Code available for payment source

## 2021-05-06 ENCOUNTER — Other Ambulatory Visit: Payer: Self-pay

## 2021-05-06 DIAGNOSIS — E119 Type 2 diabetes mellitus without complications: Secondary | ICD-10-CM | POA: Diagnosis not present

## 2021-05-06 DIAGNOSIS — K219 Gastro-esophageal reflux disease without esophagitis: Secondary | ICD-10-CM | POA: Diagnosis not present

## 2021-05-06 DIAGNOSIS — K5100 Ulcerative (chronic) pancolitis without complications: Secondary | ICD-10-CM | POA: Diagnosis not present

## 2021-05-06 DIAGNOSIS — H9211 Otorrhea, right ear: Secondary | ICD-10-CM | POA: Diagnosis not present

## 2021-05-06 MED ORDER — CLINDAMYCIN HCL 300 MG PO CAPS
ORAL_CAPSULE | ORAL | 0 refills | Status: AC
  Filled 2021-05-06: qty 42, 14d supply, fill #0

## 2021-05-06 MED ORDER — NEOMYCIN-POLYMYXIN-HC 1 % OT SOLN
OTIC | 6 refills | Status: AC
  Filled 2021-05-06: qty 10, 9d supply, fill #0

## 2021-05-18 ENCOUNTER — Other Ambulatory Visit: Payer: Self-pay

## 2021-05-19 ENCOUNTER — Other Ambulatory Visit: Payer: Self-pay

## 2021-05-19 DIAGNOSIS — H902 Conductive hearing loss, unspecified: Secondary | ICD-10-CM | POA: Diagnosis not present

## 2021-05-19 DIAGNOSIS — Z91041 Radiographic dye allergy status: Secondary | ICD-10-CM | POA: Diagnosis not present

## 2021-05-19 DIAGNOSIS — Z7982 Long term (current) use of aspirin: Secondary | ICD-10-CM | POA: Diagnosis not present

## 2021-05-19 DIAGNOSIS — G4730 Sleep apnea, unspecified: Secondary | ICD-10-CM | POA: Diagnosis not present

## 2021-05-19 DIAGNOSIS — E119 Type 2 diabetes mellitus without complications: Secondary | ICD-10-CM | POA: Diagnosis not present

## 2021-05-19 DIAGNOSIS — H9319 Tinnitus, unspecified ear: Secondary | ICD-10-CM | POA: Diagnosis not present

## 2021-05-19 DIAGNOSIS — H7191 Unspecified cholesteatoma, right ear: Secondary | ICD-10-CM | POA: Diagnosis not present

## 2021-05-19 DIAGNOSIS — H9211 Otorrhea, right ear: Secondary | ICD-10-CM | POA: Diagnosis not present

## 2021-05-19 DIAGNOSIS — H9501 Recurrent cholesteatoma of postmastoidectomy cavity, right ear: Secondary | ICD-10-CM | POA: Diagnosis not present

## 2021-05-19 DIAGNOSIS — Z7984 Long term (current) use of oral hypoglycemic drugs: Secondary | ICD-10-CM | POA: Diagnosis not present

## 2021-05-19 MED ORDER — OXYCODONE HCL 5 MG PO TABS
ORAL_TABLET | ORAL | 0 refills | Status: AC
  Filled 2021-05-19: qty 15, 3d supply, fill #0

## 2021-05-19 MED ORDER — NEOMYCIN-POLYMYXIN-HC 3.5-10000-1 OT SUSP
OTIC | 2 refills | Status: AC
  Filled 2021-05-19: qty 10, 25d supply, fill #0

## 2021-06-11 ENCOUNTER — Other Ambulatory Visit (HOSPITAL_COMMUNITY): Payer: Self-pay

## 2021-06-21 ENCOUNTER — Other Ambulatory Visit: Payer: Self-pay

## 2021-06-22 ENCOUNTER — Other Ambulatory Visit: Payer: Self-pay

## 2021-06-22 MED ORDER — LIALDA 1.2 G PO TBEC
DELAYED_RELEASE_TABLET | ORAL | 11 refills | Status: AC
  Filled 2021-06-22: qty 120, 30d supply, fill #0
  Filled 2021-09-14: qty 120, 30d supply, fill #1
  Filled 2021-11-12: qty 120, 30d supply, fill #2
  Filled 2022-01-21: qty 120, 30d supply, fill #3

## 2021-06-23 ENCOUNTER — Other Ambulatory Visit: Payer: Self-pay

## 2021-07-08 ENCOUNTER — Other Ambulatory Visit: Payer: Self-pay

## 2021-07-08 MED ORDER — NEOMYCIN-POLYMYXIN-HC 3.5-10000-1 OT SUSP
OTIC | 2 refills | Status: AC
  Filled 2021-07-08: qty 10, 25d supply, fill #0

## 2021-07-19 ENCOUNTER — Other Ambulatory Visit: Payer: Self-pay

## 2021-08-20 DIAGNOSIS — K219 Gastro-esophageal reflux disease without esophagitis: Secondary | ICD-10-CM | POA: Diagnosis not present

## 2021-08-20 DIAGNOSIS — G4733 Obstructive sleep apnea (adult) (pediatric): Secondary | ICD-10-CM | POA: Diagnosis not present

## 2021-08-20 DIAGNOSIS — K5100 Ulcerative (chronic) pancolitis without complications: Secondary | ICD-10-CM | POA: Diagnosis not present

## 2021-08-20 DIAGNOSIS — E119 Type 2 diabetes mellitus without complications: Secondary | ICD-10-CM | POA: Diagnosis not present

## 2021-08-23 ENCOUNTER — Other Ambulatory Visit: Payer: Self-pay

## 2021-08-23 DIAGNOSIS — R011 Cardiac murmur, unspecified: Secondary | ICD-10-CM | POA: Diagnosis not present

## 2021-08-23 DIAGNOSIS — Z0000 Encounter for general adult medical examination without abnormal findings: Secondary | ICD-10-CM | POA: Diagnosis not present

## 2021-08-23 DIAGNOSIS — R0789 Other chest pain: Secondary | ICD-10-CM | POA: Diagnosis not present

## 2021-08-23 DIAGNOSIS — Z23 Encounter for immunization: Secondary | ICD-10-CM | POA: Diagnosis not present

## 2021-08-23 MED ORDER — ALENDRONATE SODIUM 70 MG PO TABS
ORAL_TABLET | ORAL | 1 refills | Status: AC
  Filled 2021-08-23: qty 12, 84d supply, fill #0

## 2021-08-23 MED ORDER — PIOGLITAZONE HCL 30 MG PO TABS
ORAL_TABLET | ORAL | 1 refills | Status: AC
  Filled 2021-08-23 – 2021-09-14 (×2): qty 90, 90d supply, fill #0
  Filled 2022-01-21: qty 90, 90d supply, fill #1

## 2021-08-23 MED ORDER — PANTOPRAZOLE SODIUM 40 MG PO TBEC
DELAYED_RELEASE_TABLET | ORAL | 1 refills | Status: DC
  Filled 2021-08-23 – 2021-09-14 (×2): qty 90, 90d supply, fill #0
  Filled 2022-01-21: qty 90, 90d supply, fill #1

## 2021-08-23 MED ORDER — LISINOPRIL 2.5 MG PO TABS
ORAL_TABLET | ORAL | 1 refills | Status: AC
  Filled 2021-08-23 – 2021-11-12 (×2): qty 90, 90d supply, fill #0
  Filled 2022-01-21: qty 90, 90d supply, fill #1

## 2021-08-30 ENCOUNTER — Other Ambulatory Visit (HOSPITAL_COMMUNITY): Payer: Self-pay | Admitting: Internal Medicine

## 2021-09-10 DIAGNOSIS — Z01118 Encounter for examination of ears and hearing with other abnormal findings: Secondary | ICD-10-CM | POA: Diagnosis not present

## 2021-09-10 DIAGNOSIS — H7191 Unspecified cholesteatoma, right ear: Secondary | ICD-10-CM | POA: Diagnosis not present

## 2021-09-10 DIAGNOSIS — H9211 Otorrhea, right ear: Secondary | ICD-10-CM | POA: Diagnosis not present

## 2021-09-10 DIAGNOSIS — H6121 Impacted cerumen, right ear: Secondary | ICD-10-CM | POA: Diagnosis not present

## 2021-09-10 DIAGNOSIS — H9319 Tinnitus, unspecified ear: Secondary | ICD-10-CM | POA: Diagnosis not present

## 2021-09-14 ENCOUNTER — Other Ambulatory Visit: Payer: Self-pay

## 2021-09-15 ENCOUNTER — Other Ambulatory Visit: Payer: Self-pay

## 2021-09-27 ENCOUNTER — Other Ambulatory Visit: Payer: Self-pay

## 2021-09-27 ENCOUNTER — Ambulatory Visit
Admission: RE | Admit: 2021-09-27 | Discharge: 2021-09-27 | Disposition: A | Discharge status: Home / Self Care | Payer: Self-pay | Source: Ambulatory Visit | Attending: Internal Medicine | Admitting: Internal Medicine

## 2021-09-27 DIAGNOSIS — E1165 Type 2 diabetes mellitus with hyperglycemia: Secondary | ICD-10-CM | POA: Insufficient documentation

## 2021-09-27 DIAGNOSIS — E785 Hyperlipidemia, unspecified: Secondary | ICD-10-CM | POA: Insufficient documentation

## 2021-09-27 DIAGNOSIS — Z0000 Encounter for general adult medical examination without abnormal findings: Secondary | ICD-10-CM | POA: Insufficient documentation

## 2021-09-27 DIAGNOSIS — R0789 Other chest pain: Secondary | ICD-10-CM | POA: Insufficient documentation

## 2021-11-12 ENCOUNTER — Other Ambulatory Visit: Payer: Self-pay

## 2021-11-15 ENCOUNTER — Other Ambulatory Visit: Payer: Self-pay

## 2021-11-23 ENCOUNTER — Other Ambulatory Visit: Payer: Self-pay

## 2021-11-23 DIAGNOSIS — K5100 Ulcerative (chronic) pancolitis without complications: Secondary | ICD-10-CM | POA: Diagnosis not present

## 2021-11-23 DIAGNOSIS — E119 Type 2 diabetes mellitus without complications: Secondary | ICD-10-CM | POA: Diagnosis not present

## 2021-11-23 DIAGNOSIS — R0789 Other chest pain: Secondary | ICD-10-CM | POA: Diagnosis not present

## 2021-11-23 DIAGNOSIS — G4733 Obstructive sleep apnea (adult) (pediatric): Secondary | ICD-10-CM | POA: Diagnosis not present

## 2021-11-23 MED ORDER — MELOXICAM 15 MG PO TABS
ORAL_TABLET | ORAL | 2 refills | Status: AC
  Filled 2021-11-23: qty 30, 30d supply, fill #0
  Filled 2022-01-21: qty 30, 30d supply, fill #1

## 2021-12-20 IMAGING — CT CT CARDIAC CORONARY ARTERY CALCIUM SCORE
3 series · 14 of 20 positions shown, 16 images · non-contrast
Comparison: None.
COMPARISON: None.

Addendum:
EXAM:
OVER-READ INTERPRETATION  CT CHEST

The following report is an over-read performed by radiologist Dr.
Zomocar Favoured [REDACTED] on 09/27/2021. This
over-read does not include interpretation of cardiac or coronary
anatomy or pathology. The coronary calcium score interpretation by
the cardiologist is attached.
CLINICAL DATA: Risk stratification
Coronary Calcium Score
TECHNIQUE: The patient was scanned on a Siemens Somatom go.Top Scanner. Axial
non-contrast 3 mm slices were carried out through the heart. The
data set was analyzed on a dedicated work station and scored using
the Agatson method.

[Series 2: sa36 calcium scoring 3.00 · axial · 0.34mm/px · z∈[-1101,-1020]mm · 4 of 46 slices shown]
[im 10/46  vessel]
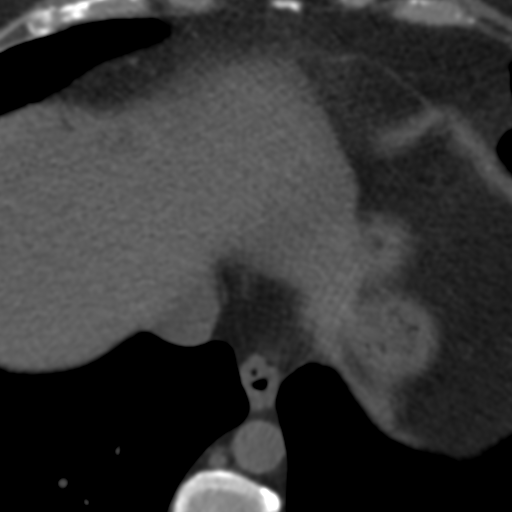
[im 19/46  vessel]
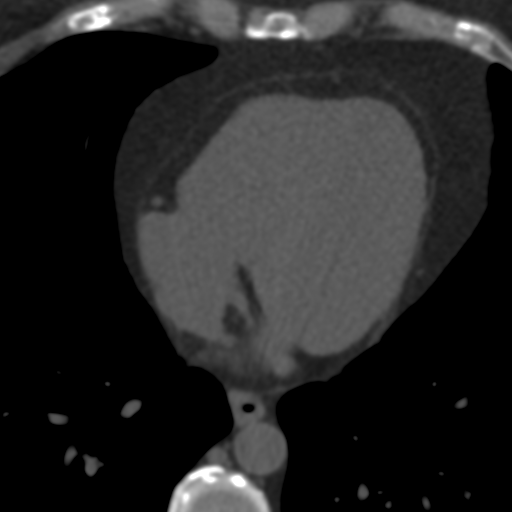
[im 28/46  vessel]
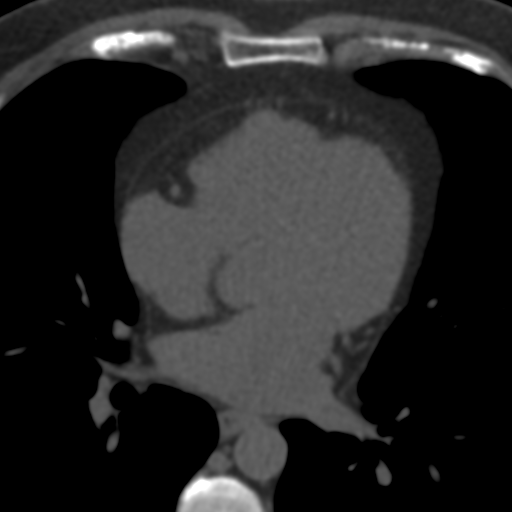
[im 37/46  vessel]
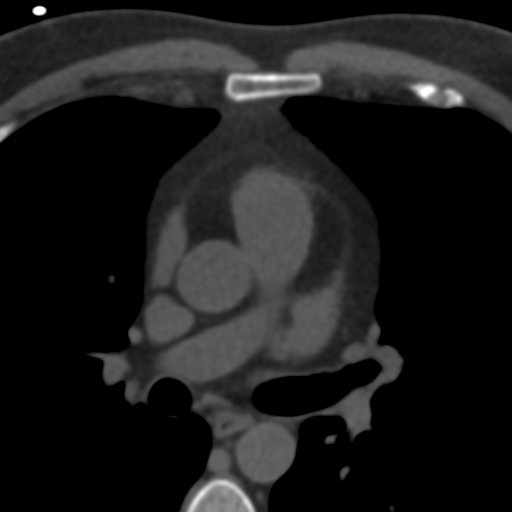

[Series 5: full fov st calcium scoring 3.00 · axial · 0.69mm/px · z∈[-1107,-1017]mm · 5 of 46 slices shown, 7 images]
[im 8/46  vessel]
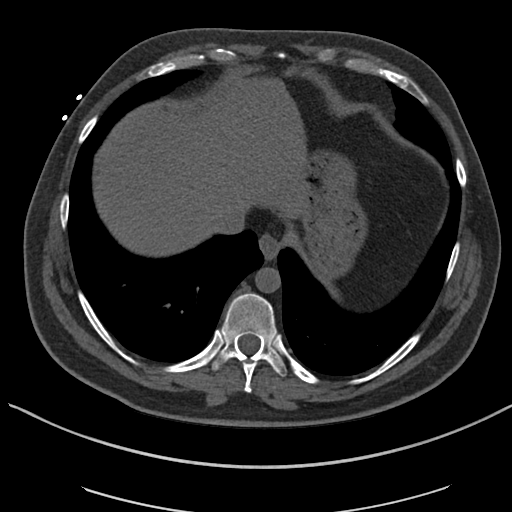
[im 8/46  lung]
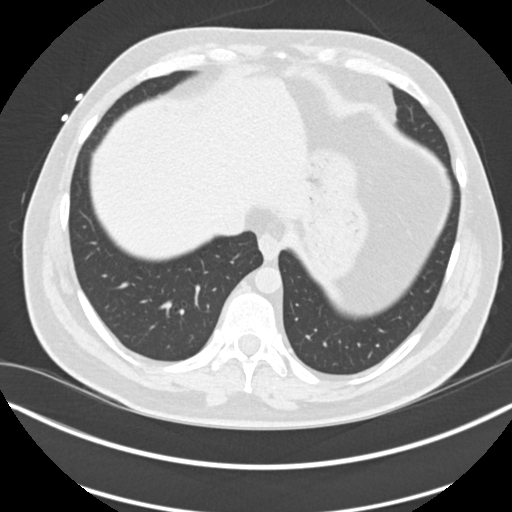
[im 16/46  vessel]
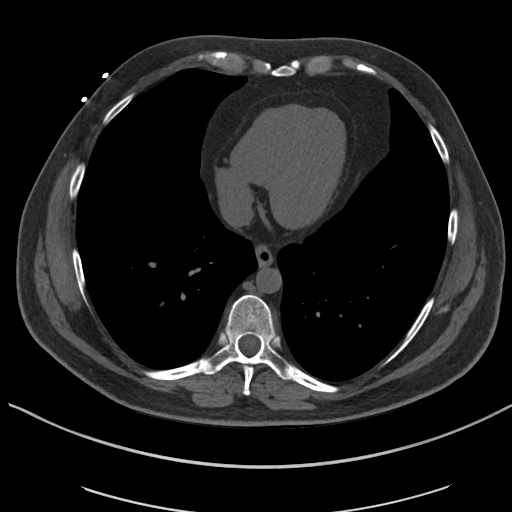
[im 23/46  vessel]
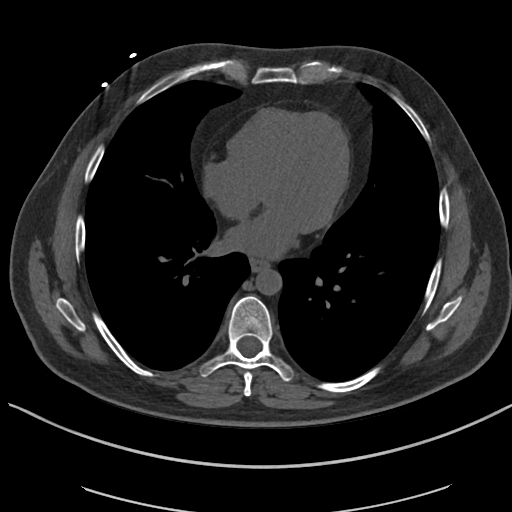
[im 31/46  vessel]
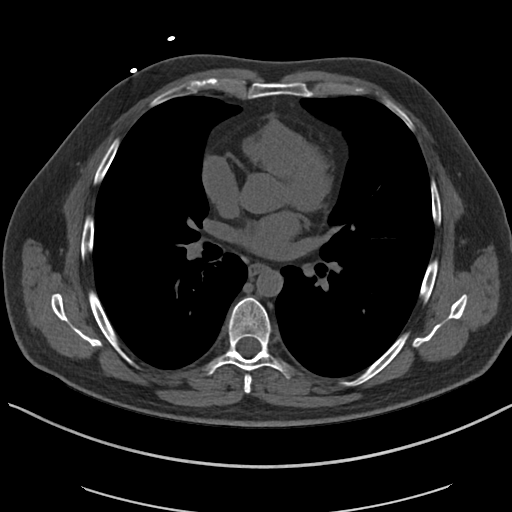
[im 38/46  vessel]
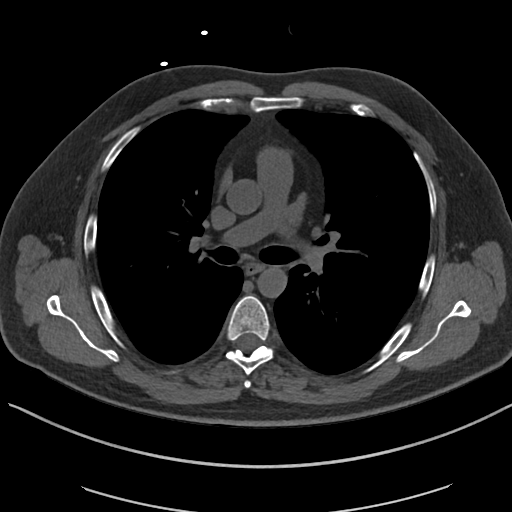
[im 38/46  lung]
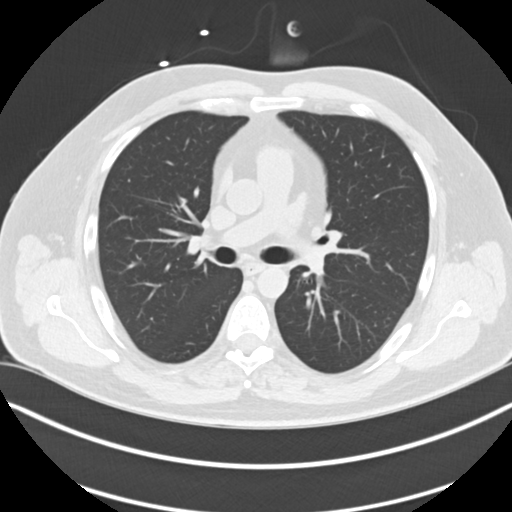

[Series 10: full fov lungs calcium scoring 3.00 ax · axial · 0.69mm/px · z∈[-1104,-1017]mm · 5 of 45 slices shown]
[im 8/45  vessel]
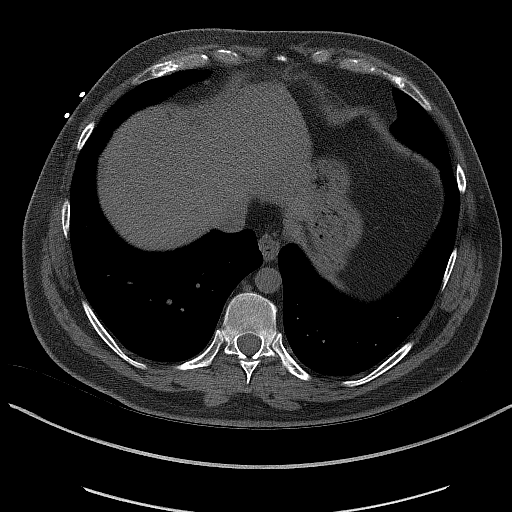
[im 15/45  vessel]
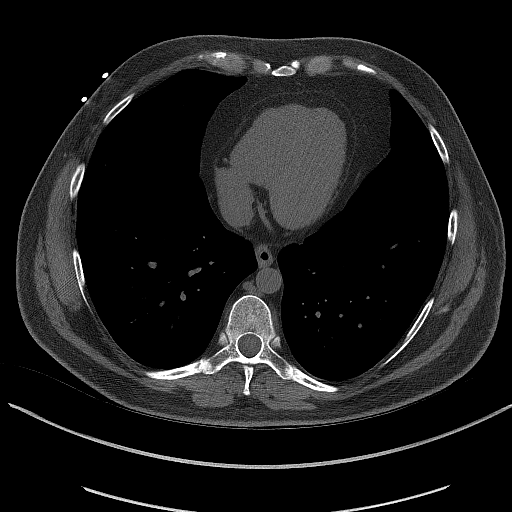
[im 23/45  vessel]
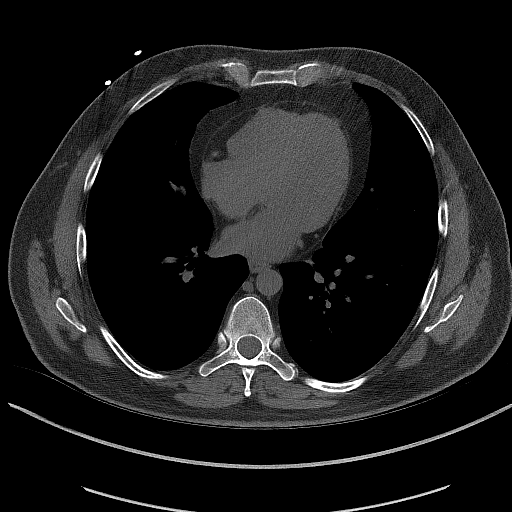
[im 30/45  vessel]
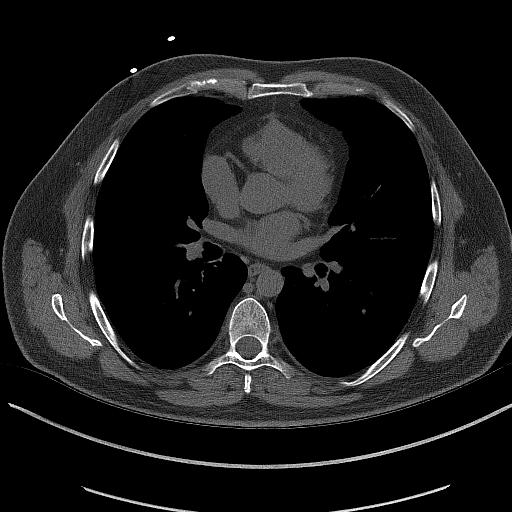
[im 37/45  vessel]
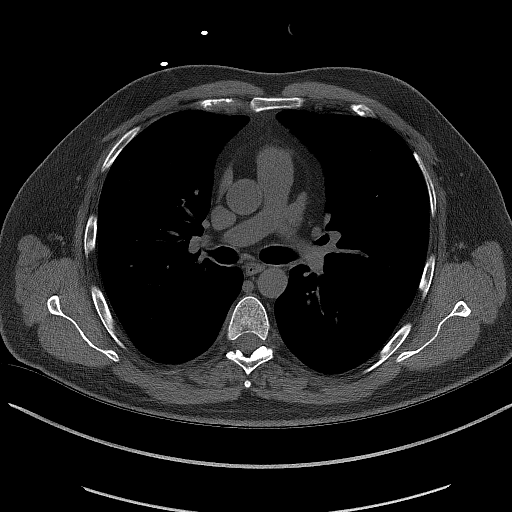

[14 of 20 positions shown; findings below may reference images not displayed]

FINDINGS: Within the visualized portions of the thorax there are no suspicious
appearing pulmonary nodules or masses, there is no acute
consolidative airspace disease, no pleural effusions, no
pneumothorax and no lymphadenopathy. Visualized portions of the
upper abdomen are unremarkable. There are no aggressive appearing
lytic or blastic lesions noted in the visualized portions of the
skeleton.
IMPRESSION: 1. No significant incidental noncardiac findings are noted.
FINDINGS: Non-cardiac: See separate report from [REDACTED].

Ascending Aorta: Normal size

Pericardium: Normal

Coronary arteries: Normal origin of left and right coronary
arteries. Distribution of arterial calcifications if present, as
noted below;

LM 0

LAD 0

LCx 0

RCA 0

Total 0

IMPRESSION AND RECOMMENDATION:
1. Coronary calcium score of 0. Patient is low risk for coronary
events.

2. CAC 0, KORIFEY [id number].

3. Continue heart healthy lifestyle and risk factor modification.

Evgeniy Cuenca

*** End of Addendum ***
EXAM:
OVER-READ INTERPRETATION  CT CHEST

The following report is an over-read performed by radiologist Dr.
Zomocar Favoured [REDACTED] on 09/27/2021. This
over-read does not include interpretation of cardiac or coronary
anatomy or pathology. The coronary calcium score interpretation by
the cardiologist is attached.
FINDINGS: Within the visualized portions of the thorax there are no suspicious
appearing pulmonary nodules or masses, there is no acute
consolidative airspace disease, no pleural effusions, no
pneumothorax and no lymphadenopathy. Visualized portions of the
upper abdomen are unremarkable. There are no aggressive appearing
lytic or blastic lesions noted in the visualized portions of the
skeleton.
IMPRESSION: 1. No significant incidental noncardiac findings are noted.

## 2022-01-21 ENCOUNTER — Other Ambulatory Visit: Payer: Self-pay

## 2022-01-21 MED ORDER — ATORVASTATIN CALCIUM 10 MG PO TABS
10.0000 mg | ORAL_TABLET | Freq: Every day | ORAL | 0 refills | Status: DC
  Filled 2022-01-21: qty 90, 90d supply, fill #0

## 2022-01-24 ENCOUNTER — Other Ambulatory Visit: Payer: Self-pay

## 2022-02-14 DIAGNOSIS — K5100 Ulcerative (chronic) pancolitis without complications: Secondary | ICD-10-CM | POA: Diagnosis not present

## 2022-02-14 DIAGNOSIS — R0789 Other chest pain: Secondary | ICD-10-CM | POA: Diagnosis not present

## 2022-02-14 DIAGNOSIS — E538 Deficiency of other specified B group vitamins: Secondary | ICD-10-CM | POA: Diagnosis not present

## 2022-02-14 DIAGNOSIS — Z0000 Encounter for general adult medical examination without abnormal findings: Secondary | ICD-10-CM | POA: Diagnosis not present

## 2022-02-14 DIAGNOSIS — D649 Anemia, unspecified: Secondary | ICD-10-CM | POA: Diagnosis not present

## 2022-02-14 DIAGNOSIS — Z125 Encounter for screening for malignant neoplasm of prostate: Secondary | ICD-10-CM | POA: Diagnosis not present

## 2022-02-14 DIAGNOSIS — E1165 Type 2 diabetes mellitus with hyperglycemia: Secondary | ICD-10-CM | POA: Diagnosis not present

## 2022-02-14 DIAGNOSIS — E785 Hyperlipidemia, unspecified: Secondary | ICD-10-CM | POA: Diagnosis not present

## 2022-02-15 ENCOUNTER — Other Ambulatory Visit: Payer: Self-pay

## 2022-02-15 MED ORDER — VITAMIN D (ERGOCALCIFEROL) 1.25 MG (50000 UNIT) PO CAPS
ORAL_CAPSULE | ORAL | 1 refills | Status: AC
  Filled 2022-02-15: qty 12, 84d supply, fill #0

## 2022-02-21 DIAGNOSIS — K5100 Ulcerative (chronic) pancolitis without complications: Secondary | ICD-10-CM | POA: Diagnosis not present

## 2022-02-21 DIAGNOSIS — E1165 Type 2 diabetes mellitus with hyperglycemia: Secondary | ICD-10-CM | POA: Diagnosis not present

## 2022-02-21 DIAGNOSIS — D649 Anemia, unspecified: Secondary | ICD-10-CM | POA: Diagnosis not present

## 2022-02-21 DIAGNOSIS — E538 Deficiency of other specified B group vitamins: Secondary | ICD-10-CM | POA: Diagnosis not present

## 2022-03-04 ENCOUNTER — Other Ambulatory Visit: Payer: Self-pay

## 2022-04-27 ENCOUNTER — Other Ambulatory Visit: Payer: Self-pay

## 2022-04-27 DIAGNOSIS — H7121 Cholesteatoma of mastoid, right ear: Secondary | ICD-10-CM | POA: Diagnosis not present

## 2022-04-27 DIAGNOSIS — H7011 Chronic mastoiditis, right ear: Secondary | ICD-10-CM | POA: Diagnosis not present

## 2022-04-28 ENCOUNTER — Other Ambulatory Visit: Payer: Self-pay

## 2022-04-28 MED ORDER — PANTOPRAZOLE SODIUM 40 MG PO TBEC
DELAYED_RELEASE_TABLET | ORAL | 1 refills | Status: DC
  Filled 2022-04-28: qty 90, 90d supply, fill #0
  Filled 2022-09-06: qty 90, 90d supply, fill #1

## 2022-05-20 DIAGNOSIS — E538 Deficiency of other specified B group vitamins: Secondary | ICD-10-CM | POA: Diagnosis not present

## 2022-05-20 DIAGNOSIS — R109 Unspecified abdominal pain: Secondary | ICD-10-CM | POA: Diagnosis not present

## 2022-05-20 DIAGNOSIS — E1165 Type 2 diabetes mellitus with hyperglycemia: Secondary | ICD-10-CM | POA: Diagnosis not present

## 2022-05-20 DIAGNOSIS — D649 Anemia, unspecified: Secondary | ICD-10-CM | POA: Diagnosis not present

## 2022-05-20 DIAGNOSIS — M818 Other osteoporosis without current pathological fracture: Secondary | ICD-10-CM | POA: Diagnosis not present

## 2022-05-20 DIAGNOSIS — R7989 Other specified abnormal findings of blood chemistry: Secondary | ICD-10-CM | POA: Diagnosis not present

## 2022-05-23 ENCOUNTER — Other Ambulatory Visit: Payer: Self-pay

## 2022-05-23 MED ORDER — VITAMIN D (ERGOCALCIFEROL) 1.25 MG (50000 UNIT) PO CAPS
50000.0000 [IU] | ORAL_CAPSULE | ORAL | 1 refills | Status: AC
Start: 2022-05-23 — End: ?
  Filled 2022-05-23: qty 12, 84d supply, fill #0
  Filled 2022-09-06: qty 12, 84d supply, fill #1

## 2022-06-01 ENCOUNTER — Other Ambulatory Visit: Payer: Self-pay

## 2022-06-01 DIAGNOSIS — E7800 Pure hypercholesterolemia, unspecified: Secondary | ICD-10-CM | POA: Diagnosis not present

## 2022-06-01 DIAGNOSIS — E538 Deficiency of other specified B group vitamins: Secondary | ICD-10-CM | POA: Diagnosis not present

## 2022-06-01 DIAGNOSIS — K219 Gastro-esophageal reflux disease without esophagitis: Secondary | ICD-10-CM | POA: Diagnosis not present

## 2022-06-01 DIAGNOSIS — K51919 Ulcerative colitis, unspecified with unspecified complications: Secondary | ICD-10-CM | POA: Diagnosis not present

## 2022-06-01 DIAGNOSIS — E1165 Type 2 diabetes mellitus with hyperglycemia: Secondary | ICD-10-CM | POA: Diagnosis not present

## 2022-06-01 DIAGNOSIS — K5100 Ulcerative (chronic) pancolitis without complications: Secondary | ICD-10-CM | POA: Diagnosis not present

## 2022-06-01 DIAGNOSIS — D649 Anemia, unspecified: Secondary | ICD-10-CM | POA: Diagnosis not present

## 2022-06-01 DIAGNOSIS — M818 Other osteoporosis without current pathological fracture: Secondary | ICD-10-CM | POA: Diagnosis not present

## 2022-06-01 DIAGNOSIS — R790 Abnormal level of blood mineral: Secondary | ICD-10-CM | POA: Diagnosis not present

## 2022-06-01 MED ORDER — LIALDA 1.2 G PO TBEC
DELAYED_RELEASE_TABLET | ORAL | 3 refills | Status: AC
  Filled 2022-06-01: qty 180, 90d supply, fill #0
  Filled 2022-11-09: qty 180, 90d supply, fill #1
  Filled 2023-03-17 (×2): qty 180, 90d supply, fill #2

## 2022-06-01 MED ORDER — POLYSACCHARIDE IRON COMPLEX 150 MG PO CAPS
ORAL_CAPSULE | ORAL | 1 refills | Status: AC
  Filled 2022-11-09: qty 90, 90d supply, fill #0
  Filled 2023-03-17: qty 90, 90d supply, fill #1

## 2022-06-01 MED ORDER — LISINOPRIL 2.5 MG PO TABS
ORAL_TABLET | ORAL | 3 refills | Status: DC
  Filled 2022-06-01: qty 90, 90d supply, fill #0
  Filled 2022-09-06: qty 90, 90d supply, fill #1
  Filled 2023-01-03: qty 90, 90d supply, fill #2

## 2022-06-01 MED ORDER — PIOGLITAZONE HCL 30 MG PO TABS
ORAL_TABLET | ORAL | 3 refills | Status: AC
  Filled 2022-06-01: qty 90, 90d supply, fill #0
  Filled 2022-09-06: qty 90, 90d supply, fill #1
  Filled 2023-01-03: qty 90, 90d supply, fill #2

## 2022-06-01 MED ORDER — VITAMIN D (ERGOCALCIFEROL) 1.25 MG (50000 UNIT) PO CAPS
50000.0000 [IU] | ORAL_CAPSULE | ORAL | 3 refills | Status: DC
  Filled 2022-06-01 – 2022-09-06 (×2): qty 12, 84d supply, fill #0
  Filled 2023-01-03: qty 12, 84d supply, fill #1
  Filled 2023-05-09: qty 12, 84d supply, fill #2

## 2022-06-02 ENCOUNTER — Other Ambulatory Visit: Payer: Self-pay

## 2022-06-13 ENCOUNTER — Other Ambulatory Visit: Payer: Self-pay

## 2022-09-01 DIAGNOSIS — K219 Gastro-esophageal reflux disease without esophagitis: Secondary | ICD-10-CM | POA: Diagnosis not present

## 2022-09-01 DIAGNOSIS — E1165 Type 2 diabetes mellitus with hyperglycemia: Secondary | ICD-10-CM | POA: Diagnosis not present

## 2022-09-01 DIAGNOSIS — R790 Abnormal level of blood mineral: Secondary | ICD-10-CM | POA: Diagnosis not present

## 2022-09-01 DIAGNOSIS — K51919 Ulcerative colitis, unspecified with unspecified complications: Secondary | ICD-10-CM | POA: Diagnosis not present

## 2022-09-01 DIAGNOSIS — E7800 Pure hypercholesterolemia, unspecified: Secondary | ICD-10-CM | POA: Diagnosis not present

## 2022-09-01 DIAGNOSIS — M818 Other osteoporosis without current pathological fracture: Secondary | ICD-10-CM | POA: Diagnosis not present

## 2022-09-01 DIAGNOSIS — K5100 Ulcerative (chronic) pancolitis without complications: Secondary | ICD-10-CM | POA: Diagnosis not present

## 2022-09-01 DIAGNOSIS — E538 Deficiency of other specified B group vitamins: Secondary | ICD-10-CM | POA: Diagnosis not present

## 2022-09-01 DIAGNOSIS — D649 Anemia, unspecified: Secondary | ICD-10-CM | POA: Diagnosis not present

## 2022-09-06 ENCOUNTER — Other Ambulatory Visit: Payer: Self-pay

## 2022-09-07 ENCOUNTER — Other Ambulatory Visit: Payer: Self-pay

## 2022-09-07 MED ORDER — ATORVASTATIN CALCIUM 10 MG PO TABS
10.0000 mg | ORAL_TABLET | Freq: Every day | ORAL | 0 refills | Status: AC
  Filled 2022-09-07: qty 90, 90d supply, fill #0

## 2022-09-19 ENCOUNTER — Other Ambulatory Visit: Payer: Self-pay

## 2022-09-19 DIAGNOSIS — D649 Anemia, unspecified: Secondary | ICD-10-CM | POA: Diagnosis not present

## 2022-09-19 DIAGNOSIS — Z23 Encounter for immunization: Secondary | ICD-10-CM | POA: Diagnosis not present

## 2022-09-19 DIAGNOSIS — H7191 Unspecified cholesteatoma, right ear: Secondary | ICD-10-CM | POA: Diagnosis not present

## 2022-09-19 DIAGNOSIS — E119 Type 2 diabetes mellitus without complications: Secondary | ICD-10-CM | POA: Diagnosis not present

## 2022-09-19 DIAGNOSIS — R790 Abnormal level of blood mineral: Secondary | ICD-10-CM | POA: Diagnosis not present

## 2022-09-19 DIAGNOSIS — K5180 Other ulcerative colitis without complications: Secondary | ICD-10-CM | POA: Diagnosis not present

## 2022-09-19 MED ORDER — FOLIVANE-PLUS PO CAPS
1.0000 | ORAL_CAPSULE | Freq: Every day | ORAL | 3 refills | Status: AC
  Filled 2022-09-19 – 2022-11-15 (×2): qty 90, 90d supply, fill #0
  Filled 2023-03-17: qty 90, 90d supply, fill #1

## 2022-09-20 ENCOUNTER — Other Ambulatory Visit: Payer: Self-pay

## 2022-09-21 ENCOUNTER — Other Ambulatory Visit: Payer: Self-pay

## 2022-10-10 DIAGNOSIS — H7011 Chronic mastoiditis, right ear: Secondary | ICD-10-CM | POA: Diagnosis not present

## 2022-10-10 DIAGNOSIS — H7121 Cholesteatoma of mastoid, right ear: Secondary | ICD-10-CM | POA: Diagnosis not present

## 2022-10-11 ENCOUNTER — Other Ambulatory Visit: Payer: Self-pay

## 2022-11-09 ENCOUNTER — Other Ambulatory Visit: Payer: Self-pay

## 2022-11-10 ENCOUNTER — Other Ambulatory Visit: Payer: Self-pay

## 2022-11-15 ENCOUNTER — Other Ambulatory Visit: Payer: Self-pay

## 2023-01-02 ENCOUNTER — Other Ambulatory Visit: Payer: Self-pay

## 2023-01-03 ENCOUNTER — Other Ambulatory Visit: Payer: Self-pay

## 2023-01-03 MED ORDER — PANTOPRAZOLE SODIUM 40 MG PO TBEC
40.0000 mg | DELAYED_RELEASE_TABLET | Freq: Every day | ORAL | 1 refills | Status: AC
  Filled 2023-01-03: qty 90, 90d supply, fill #0

## 2023-03-17 ENCOUNTER — Other Ambulatory Visit: Payer: Self-pay

## 2023-03-20 DIAGNOSIS — R790 Abnormal level of blood mineral: Secondary | ICD-10-CM | POA: Diagnosis not present

## 2023-03-20 DIAGNOSIS — E119 Type 2 diabetes mellitus without complications: Secondary | ICD-10-CM | POA: Diagnosis not present

## 2023-03-20 DIAGNOSIS — D649 Anemia, unspecified: Secondary | ICD-10-CM | POA: Diagnosis not present

## 2023-03-27 ENCOUNTER — Other Ambulatory Visit: Payer: Self-pay

## 2023-03-27 DIAGNOSIS — D649 Anemia, unspecified: Secondary | ICD-10-CM | POA: Diagnosis not present

## 2023-03-27 DIAGNOSIS — K219 Gastro-esophageal reflux disease without esophagitis: Secondary | ICD-10-CM | POA: Diagnosis not present

## 2023-03-27 DIAGNOSIS — K5180 Other ulcerative colitis without complications: Secondary | ICD-10-CM | POA: Diagnosis not present

## 2023-03-27 DIAGNOSIS — E1165 Type 2 diabetes mellitus with hyperglycemia: Secondary | ICD-10-CM | POA: Diagnosis not present

## 2023-03-27 DIAGNOSIS — Z1331 Encounter for screening for depression: Secondary | ICD-10-CM | POA: Diagnosis not present

## 2023-03-27 DIAGNOSIS — M818 Other osteoporosis without current pathological fracture: Secondary | ICD-10-CM | POA: Diagnosis not present

## 2023-03-27 DIAGNOSIS — Z0000 Encounter for general adult medical examination without abnormal findings: Secondary | ICD-10-CM | POA: Diagnosis not present

## 2023-03-27 MED ORDER — PANTOPRAZOLE SODIUM 40 MG PO TBEC
40.0000 mg | DELAYED_RELEASE_TABLET | Freq: Every day | ORAL | 3 refills | Status: AC
  Filled 2023-03-27: qty 90, 90d supply, fill #0
  Filled 2023-08-27: qty 90, 90d supply, fill #1
  Filled 2023-11-28: qty 90, 90d supply, fill #2
  Filled 2024-02-15: qty 90, 90d supply, fill #3

## 2023-03-27 MED ORDER — MESALAMINE 1.2 G PO TBEC
2.4000 g | DELAYED_RELEASE_TABLET | Freq: Every day | ORAL | 3 refills | Status: AC
  Filled 2023-03-27: qty 120, 60d supply, fill #0
  Filled 2023-03-27: qty 180, 90d supply, fill #0
  Filled 2023-03-27: qty 60, 30d supply, fill #0
  Filled 2023-07-18: qty 180, 90d supply, fill #1
  Filled 2023-10-06: qty 180, 90d supply, fill #2

## 2023-03-27 MED ORDER — PIOGLITAZONE HCL 30 MG PO TABS
30.0000 mg | ORAL_TABLET | Freq: Every day | ORAL | 3 refills | Status: DC
  Filled 2023-03-27: qty 61, 61d supply, fill #0
  Filled 2023-03-27: qty 90, 90d supply, fill #0
  Filled 2023-03-27: qty 29, 29d supply, fill #0
  Filled 2023-10-06: qty 90, 90d supply, fill #1
  Filled 2024-02-15: qty 90, 90d supply, fill #2

## 2023-03-27 MED ORDER — FOLIVANE-PLUS PO CAPS
1.0000 | ORAL_CAPSULE | Freq: Every day | ORAL | 3 refills | Status: AC
  Filled 2023-03-27: qty 90, 90d supply, fill #0
  Filled 2023-07-18: qty 90, 90d supply, fill #1

## 2023-04-10 DIAGNOSIS — H7011 Chronic mastoiditis, right ear: Secondary | ICD-10-CM | POA: Diagnosis not present

## 2023-04-10 DIAGNOSIS — H7121 Cholesteatoma of mastoid, right ear: Secondary | ICD-10-CM | POA: Diagnosis not present

## 2023-05-09 ENCOUNTER — Other Ambulatory Visit: Payer: Self-pay

## 2023-05-10 ENCOUNTER — Other Ambulatory Visit: Payer: Self-pay

## 2023-05-10 MED ORDER — ATORVASTATIN CALCIUM 10 MG PO TABS
10.0000 mg | ORAL_TABLET | Freq: Every day | ORAL | 0 refills | Status: AC
  Filled 2023-05-10: qty 90, 90d supply, fill #0

## 2023-07-18 ENCOUNTER — Other Ambulatory Visit: Payer: Self-pay

## 2023-07-18 MED ORDER — LISINOPRIL 2.5 MG PO TABS
2.5000 mg | ORAL_TABLET | Freq: Every day | ORAL | 3 refills | Status: AC
  Filled 2023-07-18: qty 90, 90d supply, fill #0
  Filled 2023-10-06: qty 90, 90d supply, fill #1
  Filled 2024-02-15: qty 90, 90d supply, fill #2
  Filled 2024-06-17: qty 90, 90d supply, fill #3

## 2023-07-19 ENCOUNTER — Other Ambulatory Visit: Payer: Self-pay

## 2023-07-20 ENCOUNTER — Other Ambulatory Visit: Payer: Self-pay

## 2023-08-21 DIAGNOSIS — T781XXA Other adverse food reactions, not elsewhere classified, initial encounter: Secondary | ICD-10-CM | POA: Diagnosis not present

## 2023-08-21 DIAGNOSIS — L500 Allergic urticaria: Secondary | ICD-10-CM | POA: Diagnosis not present

## 2023-08-27 ENCOUNTER — Other Ambulatory Visit: Payer: Self-pay

## 2023-08-28 ENCOUNTER — Other Ambulatory Visit: Payer: Self-pay

## 2023-08-28 MED ORDER — VITAMIN D (ERGOCALCIFEROL) 1.25 MG (50000 UNIT) PO CAPS
50000.0000 [IU] | ORAL_CAPSULE | ORAL | 3 refills | Status: DC
  Filled 2023-08-28: qty 12, 84d supply, fill #0
  Filled 2023-11-28: qty 12, 84d supply, fill #1
  Filled 2024-02-15: qty 12, 84d supply, fill #2
  Filled 2024-06-17: qty 12, 84d supply, fill #3

## 2023-09-27 DIAGNOSIS — H524 Presbyopia: Secondary | ICD-10-CM | POA: Diagnosis not present

## 2023-10-06 ENCOUNTER — Other Ambulatory Visit: Payer: Self-pay

## 2023-10-08 ENCOUNTER — Other Ambulatory Visit: Payer: Self-pay

## 2023-10-09 ENCOUNTER — Other Ambulatory Visit: Payer: Self-pay

## 2023-10-09 MED ORDER — ATORVASTATIN CALCIUM 10 MG PO TABS
10.0000 mg | ORAL_TABLET | Freq: Every day | ORAL | 0 refills | Status: DC
  Filled 2023-10-09: qty 90, 90d supply, fill #0

## 2023-10-12 DIAGNOSIS — Z0000 Encounter for general adult medical examination without abnormal findings: Secondary | ICD-10-CM | POA: Diagnosis not present

## 2023-10-12 DIAGNOSIS — E1165 Type 2 diabetes mellitus with hyperglycemia: Secondary | ICD-10-CM | POA: Diagnosis not present

## 2023-10-12 DIAGNOSIS — E785 Hyperlipidemia, unspecified: Secondary | ICD-10-CM | POA: Diagnosis not present

## 2023-10-12 DIAGNOSIS — K5180 Other ulcerative colitis without complications: Secondary | ICD-10-CM | POA: Diagnosis not present

## 2023-10-12 DIAGNOSIS — E538 Deficiency of other specified B group vitamins: Secondary | ICD-10-CM | POA: Diagnosis not present

## 2023-10-12 DIAGNOSIS — K219 Gastro-esophageal reflux disease without esophagitis: Secondary | ICD-10-CM | POA: Diagnosis not present

## 2023-10-12 DIAGNOSIS — M818 Other osteoporosis without current pathological fracture: Secondary | ICD-10-CM | POA: Diagnosis not present

## 2023-10-12 DIAGNOSIS — D649 Anemia, unspecified: Secondary | ICD-10-CM | POA: Diagnosis not present

## 2023-10-12 DIAGNOSIS — Z125 Encounter for screening for malignant neoplasm of prostate: Secondary | ICD-10-CM | POA: Diagnosis not present

## 2023-10-20 ENCOUNTER — Other Ambulatory Visit: Payer: Self-pay

## 2023-10-20 DIAGNOSIS — Z23 Encounter for immunization: Secondary | ICD-10-CM | POA: Diagnosis not present

## 2023-10-20 DIAGNOSIS — E119 Type 2 diabetes mellitus without complications: Secondary | ICD-10-CM | POA: Diagnosis not present

## 2023-10-20 DIAGNOSIS — Z1211 Encounter for screening for malignant neoplasm of colon: Secondary | ICD-10-CM | POA: Diagnosis not present

## 2023-10-20 DIAGNOSIS — R233 Spontaneous ecchymoses: Secondary | ICD-10-CM | POA: Diagnosis not present

## 2023-10-20 DIAGNOSIS — R7989 Other specified abnormal findings of blood chemistry: Secondary | ICD-10-CM | POA: Diagnosis not present

## 2023-10-20 DIAGNOSIS — D649 Anemia, unspecified: Secondary | ICD-10-CM | POA: Diagnosis not present

## 2023-10-20 DIAGNOSIS — K219 Gastro-esophageal reflux disease without esophagitis: Secondary | ICD-10-CM | POA: Diagnosis not present

## 2023-10-20 DIAGNOSIS — E538 Deficiency of other specified B group vitamins: Secondary | ICD-10-CM | POA: Diagnosis not present

## 2023-10-20 DIAGNOSIS — K5180 Other ulcerative colitis without complications: Secondary | ICD-10-CM | POA: Diagnosis not present

## 2023-10-20 MED ORDER — PANTOPRAZOLE SODIUM 40 MG PO TBEC
40.0000 mg | DELAYED_RELEASE_TABLET | Freq: Every day | ORAL | 3 refills | Status: AC
  Filled 2023-10-20 – 2024-06-17 (×3): qty 90, 90d supply, fill #0

## 2023-10-20 MED ORDER — MESALAMINE 1.2 G PO TBEC
2.4000 g | DELAYED_RELEASE_TABLET | Freq: Every day | ORAL | 3 refills | Status: AC
  Filled 2023-10-20 – 2024-02-15 (×2): qty 180, 90d supply, fill #0
  Filled 2024-06-17: qty 180, 90d supply, fill #1

## 2023-10-24 DIAGNOSIS — L501 Idiopathic urticaria: Secondary | ICD-10-CM | POA: Diagnosis not present

## 2023-10-24 DIAGNOSIS — J301 Allergic rhinitis due to pollen: Secondary | ICD-10-CM | POA: Diagnosis not present

## 2023-10-24 DIAGNOSIS — J3089 Other allergic rhinitis: Secondary | ICD-10-CM | POA: Diagnosis not present

## 2023-11-20 ENCOUNTER — Other Ambulatory Visit: Payer: Self-pay

## 2023-11-21 ENCOUNTER — Other Ambulatory Visit: Payer: Self-pay

## 2023-11-22 ENCOUNTER — Other Ambulatory Visit: Payer: Self-pay

## 2023-11-22 MED ORDER — FOLIVANE-PLUS PO CAPS
1.0000 | ORAL_CAPSULE | Freq: Every day | ORAL | 3 refills | Status: AC
  Filled 2023-11-22: qty 90, 90d supply, fill #0
  Filled 2024-02-15: qty 90, 90d supply, fill #1

## 2023-11-23 ENCOUNTER — Other Ambulatory Visit: Payer: Self-pay

## 2023-11-27 ENCOUNTER — Other Ambulatory Visit: Payer: Self-pay

## 2024-02-15 ENCOUNTER — Other Ambulatory Visit: Payer: Self-pay

## 2024-02-16 ENCOUNTER — Other Ambulatory Visit: Payer: Self-pay

## 2024-02-19 ENCOUNTER — Other Ambulatory Visit: Payer: Self-pay

## 2024-04-02 DIAGNOSIS — E119 Type 2 diabetes mellitus without complications: Secondary | ICD-10-CM | POA: Diagnosis not present

## 2024-04-02 DIAGNOSIS — K219 Gastro-esophageal reflux disease without esophagitis: Secondary | ICD-10-CM | POA: Diagnosis not present

## 2024-04-02 DIAGNOSIS — E538 Deficiency of other specified B group vitamins: Secondary | ICD-10-CM | POA: Diagnosis not present

## 2024-04-02 DIAGNOSIS — K5180 Other ulcerative colitis without complications: Secondary | ICD-10-CM | POA: Diagnosis not present

## 2024-04-02 DIAGNOSIS — D649 Anemia, unspecified: Secondary | ICD-10-CM | POA: Diagnosis not present

## 2024-04-02 DIAGNOSIS — R7989 Other specified abnormal findings of blood chemistry: Secondary | ICD-10-CM | POA: Diagnosis not present

## 2024-04-02 DIAGNOSIS — E785 Hyperlipidemia, unspecified: Secondary | ICD-10-CM | POA: Diagnosis not present

## 2024-04-08 DIAGNOSIS — Z1331 Encounter for screening for depression: Secondary | ICD-10-CM | POA: Diagnosis not present

## 2024-04-08 DIAGNOSIS — K219 Gastro-esophageal reflux disease without esophagitis: Secondary | ICD-10-CM | POA: Diagnosis not present

## 2024-04-08 DIAGNOSIS — Z0000 Encounter for general adult medical examination without abnormal findings: Secondary | ICD-10-CM | POA: Diagnosis not present

## 2024-04-08 DIAGNOSIS — H7191 Unspecified cholesteatoma, right ear: Secondary | ICD-10-CM | POA: Diagnosis not present

## 2024-04-08 DIAGNOSIS — K5190 Ulcerative colitis, unspecified, without complications: Secondary | ICD-10-CM | POA: Diagnosis not present

## 2024-04-08 DIAGNOSIS — D649 Anemia, unspecified: Secondary | ICD-10-CM | POA: Diagnosis not present

## 2024-04-08 DIAGNOSIS — E538 Deficiency of other specified B group vitamins: Secondary | ICD-10-CM | POA: Diagnosis not present

## 2024-04-08 DIAGNOSIS — E119 Type 2 diabetes mellitus without complications: Secondary | ICD-10-CM | POA: Diagnosis not present

## 2024-04-08 DIAGNOSIS — R7989 Other specified abnormal findings of blood chemistry: Secondary | ICD-10-CM | POA: Diagnosis not present

## 2024-06-17 ENCOUNTER — Other Ambulatory Visit: Payer: Self-pay

## 2024-06-17 MED ORDER — ATORVASTATIN CALCIUM 10 MG PO TABS
10.0000 mg | ORAL_TABLET | Freq: Every day | ORAL | 1 refills | Status: AC
  Filled 2024-06-17: qty 90, 90d supply, fill #0

## 2024-06-17 MED ORDER — PIOGLITAZONE HCL 30 MG PO TABS
30.0000 mg | ORAL_TABLET | Freq: Every day | ORAL | 1 refills | Status: AC
  Filled 2024-06-17: qty 90, 90d supply, fill #0
  Filled 2024-11-03: qty 90, 90d supply, fill #1

## 2024-06-18 ENCOUNTER — Other Ambulatory Visit: Payer: Self-pay

## 2024-06-18 DIAGNOSIS — G4733 Obstructive sleep apnea (adult) (pediatric): Secondary | ICD-10-CM | POA: Diagnosis not present

## 2024-06-18 DIAGNOSIS — D500 Iron deficiency anemia secondary to blood loss (chronic): Secondary | ICD-10-CM | POA: Diagnosis not present

## 2024-06-18 DIAGNOSIS — K5100 Ulcerative (chronic) pancolitis without complications: Secondary | ICD-10-CM | POA: Diagnosis not present

## 2024-06-18 DIAGNOSIS — E119 Type 2 diabetes mellitus without complications: Secondary | ICD-10-CM | POA: Diagnosis not present

## 2024-06-18 DIAGNOSIS — K219 Gastro-esophageal reflux disease without esophagitis: Secondary | ICD-10-CM | POA: Diagnosis not present

## 2024-06-18 MED ORDER — NA SULFATE-K SULFATE-MG SULF 17.5-3.13-1.6 GM/177ML PO SOLN
ORAL | 0 refills | Status: DC
  Filled 2024-06-18: qty 354, 1d supply, fill #0

## 2024-06-20 DIAGNOSIS — D500 Iron deficiency anemia secondary to blood loss (chronic): Secondary | ICD-10-CM | POA: Diagnosis not present

## 2024-06-21 ENCOUNTER — Other Ambulatory Visit: Payer: Self-pay

## 2024-06-25 ENCOUNTER — Encounter
Admission: RE | Disposition: A | Discharge status: Home / Self Care | Payer: Self-pay | Source: Home / Self Care | Attending: Internal Medicine

## 2024-06-25 ENCOUNTER — Other Ambulatory Visit: Payer: Self-pay

## 2024-06-25 ENCOUNTER — Encounter: Payer: Self-pay | Admitting: Internal Medicine

## 2024-06-25 ENCOUNTER — Ambulatory Visit: Admitting: Anesthesiology

## 2024-06-25 ENCOUNTER — Ambulatory Visit
Admission: RE | Admit: 2024-06-25 | Discharge: 2024-06-25 | Disposition: A | Discharge status: Home / Self Care | Attending: Internal Medicine | Admitting: Internal Medicine

## 2024-06-25 DIAGNOSIS — G4730 Sleep apnea, unspecified: Secondary | ICD-10-CM | POA: Insufficient documentation

## 2024-06-25 DIAGNOSIS — D509 Iron deficiency anemia, unspecified: Secondary | ICD-10-CM | POA: Diagnosis not present

## 2024-06-25 DIAGNOSIS — K5140 Inflammatory polyps of colon without complications: Secondary | ICD-10-CM | POA: Insufficient documentation

## 2024-06-25 DIAGNOSIS — Z1211 Encounter for screening for malignant neoplasm of colon: Secondary | ICD-10-CM | POA: Insufficient documentation

## 2024-06-25 DIAGNOSIS — E119 Type 2 diabetes mellitus without complications: Secondary | ICD-10-CM | POA: Insufficient documentation

## 2024-06-25 DIAGNOSIS — K219 Gastro-esophageal reflux disease without esophagitis: Secondary | ICD-10-CM | POA: Diagnosis not present

## 2024-06-25 DIAGNOSIS — Z79899 Other long term (current) drug therapy: Secondary | ICD-10-CM | POA: Insufficient documentation

## 2024-06-25 DIAGNOSIS — K2289 Other specified disease of esophagus: Secondary | ICD-10-CM | POA: Diagnosis not present

## 2024-06-25 DIAGNOSIS — K5100 Ulcerative (chronic) pancolitis without complications: Secondary | ICD-10-CM | POA: Insufficient documentation

## 2024-06-25 DIAGNOSIS — K6389 Other specified diseases of intestine: Secondary | ICD-10-CM | POA: Diagnosis not present

## 2024-06-25 DIAGNOSIS — Z8711 Personal history of peptic ulcer disease: Secondary | ICD-10-CM | POA: Insufficient documentation

## 2024-06-25 DIAGNOSIS — Z7984 Long term (current) use of oral hypoglycemic drugs: Secondary | ICD-10-CM | POA: Insufficient documentation

## 2024-06-25 DIAGNOSIS — K635 Polyp of colon: Secondary | ICD-10-CM | POA: Diagnosis not present

## 2024-06-25 HISTORY — PX: POLYPECTOMY: SHX149

## 2024-06-25 HISTORY — PX: ESOPHAGOGASTRODUODENOSCOPY: SHX5428

## 2024-06-25 HISTORY — PX: COLONOSCOPY: SHX5424

## 2024-06-25 LAB — GLUCOSE, CAPILLARY: Glucose-Capillary: 174 mg/dL — ABNORMAL HIGH (ref 70–99)

## 2024-06-25 SURGERY — COLONOSCOPY
Anesthesia: General

## 2024-06-25 MED ORDER — DEXMEDETOMIDINE HCL IN NACL 80 MCG/20ML IV SOLN
INTRAVENOUS | Status: DC | PRN
Start: 2024-06-25 — End: 2024-06-25
  Administered 2024-06-25: 8 ug via INTRAVENOUS
  Administered 2024-06-25: 12 ug via INTRAVENOUS

## 2024-06-25 MED ORDER — GLYCOPYRROLATE 0.2 MG/ML IJ SOLN
INTRAMUSCULAR | Status: AC
  Filled 2024-06-25: qty 1

## 2024-06-25 MED ORDER — PROPOFOL 1000 MG/100ML IV EMUL
INTRAVENOUS | Status: AC
Start: 2024-06-25 — End: 2024-06-25
  Filled 2024-06-25: qty 100

## 2024-06-25 MED ORDER — GLYCOPYRROLATE 0.2 MG/ML IJ SOLN
INTRAMUSCULAR | Status: DC | PRN
  Administered 2024-06-25: .2 mg via INTRAVENOUS

## 2024-06-25 MED ORDER — LIDOCAINE HCL (CARDIAC) PF 100 MG/5ML IV SOSY
PREFILLED_SYRINGE | INTRAVENOUS | Status: DC | PRN
  Administered 2024-06-25: 60 mg via INTRAVENOUS

## 2024-06-25 MED ORDER — LIDOCAINE HCL (PF) 2 % IJ SOLN
INTRAMUSCULAR | Status: AC
  Filled 2024-06-25: qty 5

## 2024-06-25 MED ORDER — SODIUM CHLORIDE 0.9 % IV SOLN: INTRAVENOUS | Status: DC

## 2024-06-25 MED ORDER — PROPOFOL 10 MG/ML IV BOLUS
INTRAVENOUS | Status: DC | PRN
  Administered 2024-06-25: 50 mg via INTRAVENOUS

## 2024-06-25 MED ORDER — PROPOFOL 500 MG/50ML IV EMUL
INTRAVENOUS | Status: DC | PRN
  Administered 2024-06-25: 75 ug/kg/min via INTRAVENOUS

## 2024-06-25 MED ORDER — DEXMEDETOMIDINE HCL IN NACL 80 MCG/20ML IV SOLN
INTRAVENOUS | Status: AC
  Filled 2024-06-25: qty 20

## 2024-06-25 NOTE — Op Note (Addendum)
 [facility] Gastroenterology Patient Name: Dalton Cole Procedure Date: 06/25/2024 7:00 AM MRN: [id number] Account #: [id number] Date of Birth: 06-Nov-1976 Admit Type: Outpatient Age: 48 Room: [facility] ENDO ROOM 1 Gender: Male Note Status: Supervisor Override Instrument Name: Arvis [id number] Procedure:             Colonoscopy Indications:           High risk colon cancer surveillance: Ulcerative                         pancolitis of 8 (or more) years duration, Iron                          deficiency anemia Providers:             Daphnie Venturini K. Aundria MD, MD Referring MD:          Ramie Palladino K. Aundria MD, MD (Referring MD), Tamra Leventhal, MD (Referring MD) Medicines:             Propofol  per Anesthesia Complications:         No immediate complications. Estimated blood loss:                         Minimal. Procedure:             Pre-Anesthesia Assessment:                        - The risks and benefits of the procedure and the                         sedation options and risks were discussed with the                         patient. All questions were answered and informed                         consent was obtained.                        - Patient identification and proposed procedure were                         verified prior to the procedure by the nurse. The                         procedure was verified in the procedure room.                        - ASA Grade Assessment: II - A patient with mild                         systemic disease.                        - After reviewing the risks and benefits, the patient  was deemed in satisfactory condition to undergo the                         procedure.                        After obtaining informed consent, the colonoscope was                         passed under direct vision. Throughout the procedure,                         the patient's blood  pressure, pulse, and oxygen                         saturations were monitored continuously. The                         Colonoscope was introduced through the anus and                         advanced to the the terminal ileum, with                         identification of the appendiceal orifice and IC                         valve. The colonoscopy was performed without                         difficulty. The patient tolerated the procedure well.                         The quality of the bowel preparation was excellent.                         The terminal ileum, ileocecal valve, appendiceal                         orifice, and rectum were photographed. Findings:      The perianal and digital rectal examinations were normal. Pertinent       negatives include normal sphincter tone and no palpable rectal lesions.      A 10 mm polyp was found in the descending colon. The polyp was       semi-pedunculated. The polyp was removed with a hot snare. Resection and       retrieval were complete. Estimated blood loss: none.      The exam was otherwise without abnormality.      Two biopsies were taken every 10 cm with a cold forceps from the entire       colon for ulcerative colitis surveillance. These biopsy specimens from       the cecum, ascending colon, transverse colon, descending colon, sigmoid       colon and rectum were sent to Pathology. Estimated blood loss was       minimal.      The retroflexed view of the distal rectum and anal verge was normal and       showed no anal or rectal abnormalities.      The terminal  ileum appeared normal. Impression:            - One 10 mm polyp in the descending colon, removed                         with a hot snare. Resected and retrieved.                        - The examination was otherwise normal.                        - The distal rectum and anal verge are normal on                         retroflexion view.                        - The  examined portion of the ileum was normal.                        - Biopsies for surveillance were taken from the entire                         colon. Recommendation:        - Await pathology results from EGD, also performed                         today.                        - Patient has a contact number available for                         emergencies. The signs and symptoms of potential                         delayed complications were discussed with the patient.                         Return to normal activities tomorrow. Written                         discharge instructions were provided to the patient.                        - Resume previous diet.                        - Continue present medications.                        - Await pathology results.                        - Repeat colonoscopy in 3 - 5 years for surveillance                         based on pathology results.                        - Return to my office in  3 months.                        - The findings and recommendations were discussed with                         the patient. Procedure Code(s):     --- Professional ---                        [phone], Colonoscopy, flexible; with removal of                         tumor(s), polyp(s), or other lesion(s) by snare                         technique                        45380, 59, Colonoscopy, flexible; with biopsy, single                         or multiple Diagnosis Code(s):     --- Professional ---                        D12.4, Benign neoplasm of descending colon                        K51.00, Ulcerative (chronic) pancolitis without                         complications CPT copyright 2022 American [employer] Association. All rights reserved. The codes documented in this report are preliminary and upon coder review may  be revised to meet current compliance requirements. Ladell MARLA Boss MD, MD 06/25/2024 9:03:35 AM This report has been signed  electronically. Number of Addenda: 0 Note Initiated On: 06/25/2024 7:00 AM Scope Withdrawal Time: 0 hours 10 minutes 16 seconds  Total Procedure Duration: 0 hours 12 minutes 13 seconds  Estimated Blood Loss:  Estimated blood loss was minimal. Estimated blood loss                         was minimal.      [facility]

## 2024-06-25 NOTE — Op Note (Signed)
 [facility] Gastroenterology Patient Name: Dalton Cole Procedure Date: 06/25/2024 8:32 AM MRN: [id number] Account #: [id number] Date of Birth: 23-Dec-1975 Admit Type: Outpatient Age: 48 Room: [facility] ENDO ROOM 1 Gender: Male Note Status: Finalized Instrument Name: Upper Endoscope [id number] Procedure:             Upper GI endoscopy Indications:           Unexplained iron  deficiency anemia, Gastro-esophageal                         reflux disease Providers:             Edna Grover K. Zriyah Kopplin MD, MD Medicines:             Propofol  per Anesthesia Complications:         No immediate complications. Estimated blood loss:                         Minimal. Procedure:             Pre-Anesthesia Assessment:                        - The risks and benefits of the procedure and the                         sedation options and risks were discussed with the                         patient. All questions were answered and informed                         consent was obtained.                        - Patient identification and proposed procedure were                         verified prior to the procedure by the nurse. The                         procedure was verified in the procedure room.                        - ASA Grade Assessment: II - A patient with mild                         systemic disease.                        - After reviewing the risks and benefits, the patient                         was deemed in satisfactory condition to undergo the                         procedure.                        After obtaining informed consent, the endoscope was  passed under direct vision. Throughout the procedure,                         the patient's blood pressure, pulse, and oxygen                         saturations were monitored continuously. The Endoscope                         was introduced through the mouth, and advanced to the                          third part of duodenum. The upper GI endoscopy was                         accomplished without difficulty. The patient tolerated                         the procedure well. Findings:      One tongue of salmon-colored mucosa was present from 32 to 33 cm. No       other visible abnormalities were present. The maximum longitudinal       extent of these esophageal mucosal changes was 2 cm in length. Mucosa       was biopsied with a cold forceps for histology. One specimen bottle was       sent to pathology. Estimated blood loss was minimal.      The exam of the esophagus was otherwise normal.      The stomach was normal.      The examined duodenum was normal. Biopsies for histology were taken with       a cold forceps for evaluation of celiac disease. Estimated blood loss       was minimal. Impression:            - Salmon-colored mucosa suspicious for short-segment                         Barrett's esophagus. Biopsied.                        - Normal stomach.                        - Normal examined duodenum. Biopsied. Recommendation:        - Await pathology results.                        - Proceed with colonoscopy Procedure Code(s):     --- Professional ---                        [phone], Esophagogastroduodenoscopy, flexible,                         transoral; with biopsy, single or multiple Diagnosis Code(s):     --- Professional ---                        K21.9, Gastro-esophageal reflux disease without  esophagitis                        D50.9, Iron  deficiency anemia, unspecified                        K22.89, Other specified disease of esophagus CPT copyright 2022 American [employer] Association. All rights reserved. The codes documented in this report are preliminary and upon coder review may  be revised to meet current compliance requirements. Ladell MARLA Boss MD, MD 06/25/2024 8:45:00 AM This report has been signed electronically. Number of Addenda:  0 Note Initiated On: 06/25/2024 8:32 AM Estimated Blood Loss:  Estimated blood loss was minimal.      [facility]

## 2024-06-25 NOTE — Interval H&P Note (Signed)
 History and Physical Interval Note:  06/25/2024 8:32 AM  Dalton Cole  has presented today for surgery, with the diagnosis of Ulcerative pancolitis without complication (CMS/HHS-HCC) [K51.00].  The various methods of treatment have been discussed with the patient and family. After consideration of risks, benefits and other options for treatment, the patient has consented to  Procedure(s) with comments: COLONOSCOPY (N/A) - DM as a surgical intervention.  The patient's history has been reviewed, patient examined, no change in status, stable for surgery.  I have reviewed the patient's chart and labs.  Questions were answered to the patient's satisfaction.     [place], Teegan Guinther

## 2024-06-25 NOTE — Transfer of Care (Signed)
 Immediate Anesthesia Transfer of Care Note  Patient: Dalton Cole  Procedure(s) Performed: COLONOSCOPY EGD (ESOPHAGOGASTRODUODENOSCOPY)  Patient Location: PACU  Anesthesia Type:General  Level of Consciousness: sedated  Airway & Oxygen Therapy: Patient Spontanous Breathing  Post-op Assessment: Report given to RN and Post -op Vital signs reviewed and stable  Post vital signs: Reviewed and stable  Last Vitals:  Vitals Value Taken Time  BP 98/72 06/25/24 09:03  Temp 35.8 C 06/25/24 09:03  Pulse 77 06/25/24 09:05  Resp 13 06/25/24 09:05  SpO2 97 % 06/25/24 09:05  Vitals shown include unfiled device data.  Last Pain:  Vitals:   06/25/24 0903  TempSrc: Temporal  PainSc: Asleep         Complications: No notable events documented.

## 2024-06-25 NOTE — H&P (Signed)
 Outpatient short stay form Pre-procedure 06/25/2024 8:31 AM Dalton Cole K. Dalton Cole, M.D.  Primary Physician: Dalton Cole, M.D.  Reason for visit:  Iron  deficiency anemia, Ulcerative pancolitis, GERD  History of present illness:  Mr. Dalton Cole presents for new patient visit after long absence. Colonoscopy in March 06, 2018 by Dr. Lamar Cole revealed internal hemorrhoids but was otherwise normal colonoscopy grossly, however, biopsies showed distorted crypts with no crypt abscesses. Patient previously had findings that were both histologically and endoscopically compatible with ulcerative pancolitis, mild. Patient is currently taking 2.4 g of Lialda  daily without any symptoms of abdominal pain, diarrhea, hematochezia or fecal urgency. His appetite is good and he has had no weight loss. Patient has history of chronic GERD and underwent an upper endoscopy in October 2019 with Dr. Holmes revealing a normal examination. Patient's symptoms are fully controlled as long as he takes his proton pump inhibitor daily. He denies any alarm symptoms of dysphagia, weight loss, anorexia, nausea, vomiting or hematemesis.. In the interim (since 2022) patient has seen no other gastroenterologist. He has been in relatively good health.     Current Facility-Administered Medications:    0.9 %  sodium chloride  infusion, , Intravenous, Continuous, Thomas, Shanayah Kaffenberger K, MD, Last Rate: 20 mL/hr at 06/25/24 0820, New Bag at 06/25/24 0820  Medications Prior to Admission  Medication Sig Dispense Refill Last Dose/Taking   atorvastatin  (LIPITOR) 10 MG tablet Take 10 mg by mouth 2 (two) times a week.   Past Week   atorvastatin  (LIPITOR) 10 MG tablet Take 1 tablet (10 mg total) by mouth daily. 90 tablet 1 Past Week   lisinopril  (ZESTRIL ) 2.5 MG tablet Take 1 tablet (2.5 mg total) by mouth once daily 90 tablet 1 Past Week   meloxicam  (MOBIC ) 15 MG tablet Take 1 tablet (15 mg total) by mouth once daily as needed for Pain 30  tablet 2 Past Week   mesalamine  (LIALDA ) 1.2 g EC tablet Take 1.2 g by mouth daily with breakfast.   Past Week   Multiple Vitamin (MULTI-VITAMINS) TABS Take by mouth.   Past Week   pioglitazone  (ACTOS ) 30 MG tablet Take 30 mg by mouth daily.   Past Week   Vitamin D , Ergocalciferol , (DRISDOL ) 1.25 MG (50000 UNIT) CAPS capsule Take 1 capsule by mouth once weekly 12 capsule 1 Past Week   alendronate  (FOSAMAX ) 70 MG tablet Take 1 tablet (70 mg total) by mouth every 7 (seven) days TAKE WITH A FULL GLASS OF WATER DO NOT LIE DOWN FOR THE NEXT 30 MINUTES 12 tablet 1    aspirin EC 81 MG tablet Take 81 mg by mouth daily.      atorvastatin  (LIPITOR) 10 MG tablet TAKE 1 TABLET BY MOUTH ONCE DAILY 90 tablet 0    atorvastatin  (LIPITOR) 10 MG tablet Take 1 tablet (10 mg total) by mouth daily. 90 tablet 0    benzonatate  (TESSALON ) 100 MG capsule Take 1 capsule (100 mg total) by mouth 3 (three) times daily as needed for cough. 21 capsule 0    clindamycin  (CLEOCIN ) 300 MG capsule 1 po tid 42 capsule 0    cyanocobalamin  1000 MCG tablet Take 1,000 mcg by mouth daily.      FeFum-FePoly-FA-B Cmp-C-Biot (FOLIVANE-PLUS) CAPS Take 1 capsule by mouth once daily 90 capsule 3    FeFum-FePoly-FA-B Cmp-C-Biot (FOLIVANE-PLUS) CAPS Take 1 capsule by mouth once daily 90 capsule 3    FeFum-FePoly-FA-B Cmp-C-Biot (FOLIVANE-PLUS) CAPS Take 1 capsule by mouth once daily 90 capsule 3  guaiFENesin  (MUCINEX ) 600 MG 12 hr tablet Take 2 tablets (1,200 mg total) by mouth 2 (two) times daily as needed. 12 tablet 0    ibuprofen  (ADVIL ) 600 MG tablet Take 1 tablet (600 mg total) by mouth every 6 (six) hours as needed. 30 tablet 0    iron  polysaccharides (NIFEREX) 150 MG capsule Take 1 capsule (150 mg total) by mouth once daily 90 capsule 1    LIALDA  1.2 g EC tablet TAKE 4 TABLETS (4.8 G TOTAL) BY MOUTH DAILY WITH BREAKFAST **MUST MAKE AN APPOINTMENT** 120 tablet 11    LIALDA  1.2 g EC tablet Take 2 tablets (2.4 g total) by mouth daily with  breakfast 180 tablet 3    lisinopril  (ZESTRIL ) 2.5 MG tablet Take 1 tablet (2.5 mg total) by mouth daily. 90 tablet 1    lisinopril  (ZESTRIL ) 2.5 MG tablet Take 1 tablet (2.5 mg total) by mouth daily. 90 tablet 3    mesalamine  (LIALDA ) 1.2 g EC tablet Take 2 tablets (2.4 g total) by mouth daily with breakfast. 180 tablet 3    mesalamine  (LIALDA ) 1.2 g EC tablet Take 2 tablets (2.4 g total) by mouth daily with breakfast. 180 tablet 3    Na Sulfate-K Sulfate-Mg Sulfate concentrate (SUPREP) 17.5-3.13-1.6 GM/177ML SOLN Take 1 Bottle by mouth as directed One kit contains 2 bottles.  Take both bottles at the times instructed by your provider. 354 mL 0    NEOMYCIN -POLYMYXIN-HYDROCORTISONE (CORTISPORIN ) 1 % SOLN OTIC solution 4 drops in draining ears bid for 10 days, may substitute eye drop if cheaper. 10 mL 6    neomycin -polymyxin-hydrocortisone (CORTISPORIN ) 3.5-10000-1 OTIC suspension Administer 3 drops to the right ear two (2) times a day. 10 mL 2    neomycin -polymyxin-hydrocortisone (CORTISPORIN ) 3.5-10000-1 OTIC suspension Administer 3 drops to the right ear two (2) times a day. 10 mL 2    oxyCODONE  (OXY IR/ROXICODONE ) 5 MG immediate release tablet Take 1 tablet (5 mg total) by mouth every six (6) hours as needed for pain for up to 5 days. 15 tablet 0    pantoprazole  (PROTONIX ) 40 MG tablet Take 1 tablet (40 mg total) by mouth daily. 90 tablet 0    pantoprazole  (PROTONIX ) 40 MG tablet Take 1 tablet (40 mg total) by mouth daily. 90 tablet 1    pantoprazole  (PROTONIX ) 40 MG tablet Take 1 tablet (40 mg total) by mouth daily. 90 tablet 3    pantoprazole  (PROTONIX ) 40 MG tablet Take 1 tablet (40 mg total) by mouth daily. 90 tablet 3    pioglitazone  (ACTOS ) 30 MG tablet Take 1 tablet (30 mg total) by mouth once daily 90 tablet 1    pioglitazone  (ACTOS ) 30 MG tablet Take 1 tablet (30 mg total) by mouth once daily 90 tablet 3    pioglitazone  (ACTOS ) 30 MG tablet Take 1 tablet (30 mg total) by mouth daily. 90  tablet 1    polyethylene glycol powder (GLYCOLAX /MIRALAX ) powder 255 grams one bottle for colonoscopy prep (Patient not taking: Reported on 12/10/2019) 255 g 0    predniSONE  (DELTASONE ) 10 MG tablet Take 1 tablet (10 mg total) by mouth daily. Day 1-2: Take 50 mg  ( 5 pills) Day 3-4 : Take 40 mg (4pills) Day 5-6: Take 30 mg (3 pills) Day 7-8:  Take 20 mg (2 pills) Day 9:  Take 10mg  (1 pill) (Patient not taking: Reported on 12/10/2019) 29 tablet 0    sitaGLIPtin-metformin  (JANUMET) 50-1000 MG tablet Take 1 tablet by mouth 2 (two) times  daily with a meal.       Vitamin D , Ergocalciferol , (DRISDOL ) 1.25 MG (50000 UNIT) CAPS capsule Take 1 capsule (50,000 Units total) by mouth. 12 capsule 1    Vitamin D , Ergocalciferol , (DRISDOL ) 1.25 MG (50000 UNIT) CAPS capsule Take 1 capsule (50,000 Units total) by mouth once a week. 12 capsule 3      Allergies  Allergen Reactions   Contrast Media [Iodinated Contrast Media] Hives, Swelling and Cough    Pt c/o hives, cough, sneezing, and mild swelling/itching of throat after contrast media injection.     Metrizamide Hives and Swelling     Past Medical History:  Diagnosis Date   Diabetes mellitus without complication (HCC) 2016   Elevated liver enzymes    Fatty liver    GERD (gastroesophageal reflux disease)    Heart murmur    Hepatomegaly    Hyperlipemia    Osteoporosis     Review of systems:  Otherwise negative.    Physical Exam  Gen: Alert, oriented. Appears stated age.  HEENT: [place]/AT. PERRLA. Lungs: CTA, no wheezes. CV: RR nl S1, S2. Abd: soft, benign, no masses. BS+ Ext: No edema. Pulses 2+    Planned procedures: Proceed with EGD and colonoscopy. The patient understands the nature of the planned procedure, indications, risks, alternatives and potential complications including but not limited to bleeding, infection, perforation, damage to internal organs and possible oversedation/side effects from anesthesia. The patient agrees and gives  consent to proceed.  Please refer to procedure notes for findings, recommendations and patient disposition/instructions.     Areesha Dehaven K. Dalton Cole, M.D. Gastroenterology 06/25/2024  8:31 AM

## 2024-06-25 NOTE — Anesthesia Postprocedure Evaluation (Signed)
 Anesthesia Post Note  Patient: Dalton Cole  Procedure(s) Performed: COLONOSCOPY EGD (ESOPHAGOGASTRODUODENOSCOPY)  Patient location during evaluation: PACU Anesthesia Type: General Level of consciousness: awake Pain management: satisfactory to patient Vital Signs Assessment: post-procedure vital signs reviewed and stable Respiratory status: nonlabored ventilation Cardiovascular status: stable Anesthetic complications: no   No notable events documented.   Last Vitals:  Vitals:   06/25/24 0913 06/25/24 0923  BP: 93/71 96/79  Pulse: 86 77  Resp: 13 14  Temp:    SpO2: 90% 97%    Last Pain:  Vitals:   06/25/24 0923  TempSrc:   PainSc: 0-No pain                 VAN STAVEREN,Richele Strand

## 2024-06-25 NOTE — Anesthesia Preprocedure Evaluation (Signed)
 Anesthesia Evaluation  Patient identified by MRN, date of birth, ID band Patient awake    Reviewed: Allergy & Precautions, NPO status , Patient's Chart, lab work & pertinent test results  Airway Mallampati: II  TM Distance: >3 FB Neck ROM: full    Dental  (+) Teeth Intact   Pulmonary neg pulmonary ROS, sleep apnea    Pulmonary exam normal breath sounds clear to auscultation       Cardiovascular Exercise Tolerance: Good negative cardio ROS Normal cardiovascular exam Rhythm:Regular Rate:Normal     Neuro/Psych negative neurological ROS  negative psych ROS   GI/Hepatic negative GI ROS, Neg liver ROS, PUD,GERD  Medicated,,  Endo/Other  negative endocrine ROSdiabetes, Well Controlled, Type 2, Oral Hypoglycemic Agents    Renal/GU negative Renal ROS  negative genitourinary   Musculoskeletal negative musculoskeletal ROS (+)    Abdominal Normal abdominal exam  (+)   Peds negative pediatric ROS (+)  Hematology negative hematology ROS (+)   Anesthesia Other Findings Past Medical History: 2016: Diabetes mellitus without complication (HCC) No date: Elevated liver enzymes No date: Fatty liver No date: GERD (gastroesophageal reflux disease) No date: Heart murmur No date: Hepatomegaly No date: Hyperlipemia No date: Osteoporosis  Past Surgical History: 2013?: COLONOSCOPY     Comment:  bloody stool/[place] 09/13/2017: COLONOSCOPY WITH PROPOFOL ; N/A     Comment:  Procedure: COLONOSCOPY WITH PROPOFOL ;  Surgeon: Dessa Reyes ORN, MD;  Location: [facility] ENDOSCOPY;  Service:               Endoscopy;  Laterality: N/A; 03/06/2018: COLONOSCOPY WITH PROPOFOL ; N/A     Comment:  Procedure: COLONOSCOPY WITH PROPOFOL ;  Surgeon: Viktoria Lamar DASEN, MD;  Location: [facility] ENDOSCOPY;  Service:               Endoscopy;  Laterality: N/A; No date: EAR CANALOPLASTY 09/13/2017: ESOPHAGOGASTRODUODENOSCOPY (EGD) WITH  PROPOFOL ; N/A     Comment:  Procedure: ESOPHAGOGASTRODUODENOSCOPY (EGD) WITH               PROPOFOL ;  Surgeon: Dessa Reyes ORN, MD;  Location:               [facility] ENDOSCOPY;  Service: Endoscopy;  Laterality: N/A; No date: IR FIBRIN GLUE REPAIR ANAL FISTULA No date: NASAL SEPTUM SURGERY     Reproductive/Obstetrics negative OB ROS                              Anesthesia Physical Anesthesia Plan  ASA: 2  Anesthesia Plan: General   Post-op Pain Management:    Induction: Intravenous  PONV Risk Score and Plan: Propofol  infusion and TIVA  Airway Management Planned: Natural Airway and Nasal Cannula  Additional Equipment:   Intra-op Plan:   Post-operative Plan:   Informed Consent: I have reviewed the patients History and Physical, chart, labs and discussed the procedure including the risks, benefits and alternatives for the proposed anesthesia with the patient or authorized representative who has indicated his/her understanding and acceptance.     Dental Advisory Given  Plan Discussed with: CRNA  Anesthesia Plan Comments:         Anesthesia Quick Evaluation

## 2024-06-26 ENCOUNTER — Encounter: Payer: Self-pay | Admitting: Internal Medicine

## 2024-06-27 LAB — SURGICAL PATHOLOGY

## 2024-07-30 DIAGNOSIS — Z0000 Encounter for general adult medical examination without abnormal findings: Secondary | ICD-10-CM | POA: Diagnosis not present

## 2024-07-30 DIAGNOSIS — E119 Type 2 diabetes mellitus without complications: Secondary | ICD-10-CM | POA: Diagnosis not present

## 2024-07-30 DIAGNOSIS — E538 Deficiency of other specified B group vitamins: Secondary | ICD-10-CM | POA: Diagnosis not present

## 2024-07-30 DIAGNOSIS — D649 Anemia, unspecified: Secondary | ICD-10-CM | POA: Diagnosis not present

## 2024-07-30 DIAGNOSIS — R7989 Other specified abnormal findings of blood chemistry: Secondary | ICD-10-CM | POA: Diagnosis not present

## 2024-08-08 ENCOUNTER — Other Ambulatory Visit: Payer: Self-pay

## 2024-08-08 DIAGNOSIS — E7800 Pure hypercholesterolemia, unspecified: Secondary | ICD-10-CM | POA: Diagnosis not present

## 2024-08-08 DIAGNOSIS — R7989 Other specified abnormal findings of blood chemistry: Secondary | ICD-10-CM | POA: Diagnosis not present

## 2024-08-08 DIAGNOSIS — K219 Gastro-esophageal reflux disease without esophagitis: Secondary | ICD-10-CM | POA: Diagnosis not present

## 2024-08-08 DIAGNOSIS — M818 Other osteoporosis without current pathological fracture: Secondary | ICD-10-CM | POA: Diagnosis not present

## 2024-08-08 DIAGNOSIS — E119 Type 2 diabetes mellitus without complications: Secondary | ICD-10-CM | POA: Diagnosis not present

## 2024-08-08 DIAGNOSIS — R790 Abnormal level of blood mineral: Secondary | ICD-10-CM | POA: Diagnosis not present

## 2024-08-08 DIAGNOSIS — G4733 Obstructive sleep apnea (adult) (pediatric): Secondary | ICD-10-CM | POA: Diagnosis not present

## 2024-08-08 MED ORDER — FOLIVANE-PLUS PO CAPS
1.0000 | ORAL_CAPSULE | Freq: Every day | ORAL | 3 refills | Status: AC
  Filled 2024-08-08 – 2024-09-26 (×2): qty 90, 90d supply, fill #0

## 2024-08-09 ENCOUNTER — Other Ambulatory Visit: Payer: Self-pay

## 2024-08-10 ENCOUNTER — Other Ambulatory Visit: Payer: Self-pay

## 2024-08-13 ENCOUNTER — Other Ambulatory Visit: Payer: Self-pay

## 2024-08-14 ENCOUNTER — Other Ambulatory Visit: Payer: Self-pay

## 2024-08-26 ENCOUNTER — Other Ambulatory Visit: Payer: Self-pay

## 2024-09-02 ENCOUNTER — Other Ambulatory Visit: Payer: Self-pay

## 2024-09-02 MED ORDER — MOUNJARO 2.5 MG/0.5ML ~~LOC~~ SOAJ
2.5000 mg | SUBCUTANEOUS | 2 refills | Status: AC
  Filled 2024-09-02: qty 2, 28d supply, fill #0

## 2024-09-03 ENCOUNTER — Other Ambulatory Visit: Payer: Self-pay

## 2024-09-03 MED ORDER — WEGOVY 0.25 MG/0.5ML ~~LOC~~ SOAJ
0.2500 mg | SUBCUTANEOUS | 0 refills | Status: AC
  Filled 2024-09-03: qty 2, 28d supply, fill #0

## 2024-09-12 ENCOUNTER — Other Ambulatory Visit: Payer: Self-pay

## 2024-09-26 ENCOUNTER — Other Ambulatory Visit: Payer: Self-pay

## 2024-09-27 ENCOUNTER — Other Ambulatory Visit: Payer: Self-pay

## 2024-09-27 MED ORDER — VITAMIN D (ERGOCALCIFEROL) 1.25 MG (50000 UNIT) PO CAPS
50000.0000 [IU] | ORAL_CAPSULE | ORAL | 3 refills | Status: AC
  Filled 2024-09-27 – 2024-10-18 (×2): qty 12, 84d supply, fill #0

## 2024-10-01 ENCOUNTER — Other Ambulatory Visit: Payer: Self-pay

## 2024-10-01 MED ORDER — OZEMPIC (0.25 OR 0.5 MG/DOSE) 2 MG/3ML ~~LOC~~ SOPN
0.2500 mg | PEN_INJECTOR | SUBCUTANEOUS | 2 refills | Status: DC
  Filled 2024-10-01: qty 3, 30d supply, fill #0
  Filled 2024-10-14: qty 3, 28d supply, fill #0
  Filled 2024-11-07: qty 3, 28d supply, fill #1

## 2024-10-09 ENCOUNTER — Other Ambulatory Visit: Payer: Self-pay

## 2024-10-11 ENCOUNTER — Other Ambulatory Visit: Payer: Self-pay

## 2024-10-14 ENCOUNTER — Other Ambulatory Visit: Payer: Self-pay

## 2024-10-16 ENCOUNTER — Other Ambulatory Visit: Payer: Self-pay

## 2024-10-16 MED ORDER — DEXAMETHASONE 1 MG PO TABS
1.0000 mg | ORAL_TABLET | ORAL | 0 refills | Status: AC
  Filled 2024-10-16: qty 1, 1d supply, fill #0

## 2024-10-17 DIAGNOSIS — M818 Other osteoporosis without current pathological fracture: Secondary | ICD-10-CM | POA: Diagnosis not present

## 2024-10-18 ENCOUNTER — Other Ambulatory Visit: Payer: Self-pay

## 2024-10-18 DIAGNOSIS — M818 Other osteoporosis without current pathological fracture: Secondary | ICD-10-CM | POA: Diagnosis not present

## 2024-10-23 DIAGNOSIS — M818 Other osteoporosis without current pathological fracture: Secondary | ICD-10-CM | POA: Diagnosis not present

## 2024-11-03 ENCOUNTER — Other Ambulatory Visit: Payer: Self-pay

## 2024-11-04 ENCOUNTER — Other Ambulatory Visit: Payer: Self-pay

## 2024-11-04 MED ORDER — MESALAMINE 1.2 G PO TBEC
2.4000 g | DELAYED_RELEASE_TABLET | Freq: Every day | ORAL | 3 refills | Status: AC
  Filled 2024-11-04: qty 180, 90d supply, fill #0

## 2024-11-04 MED ORDER — PANTOPRAZOLE SODIUM 40 MG PO TBEC
40.0000 mg | DELAYED_RELEASE_TABLET | Freq: Every day | ORAL | 3 refills | Status: AC
  Filled 2024-11-04: qty 90, 90d supply, fill #0

## 2024-11-04 MED ORDER — LISINOPRIL 2.5 MG PO TABS
2.5000 mg | ORAL_TABLET | Freq: Every day | ORAL | 3 refills | Status: AC
  Filled 2024-11-04: qty 90, 90d supply, fill #0

## 2024-11-07 DIAGNOSIS — H5213 Myopia, bilateral: Secondary | ICD-10-CM | POA: Diagnosis not present

## 2024-12-02 ENCOUNTER — Other Ambulatory Visit: Payer: Self-pay

## 2024-12-02 MED ORDER — OZEMPIC (0.25 OR 0.5 MG/DOSE) 2 MG/3ML ~~LOC~~ SOPN
0.2500 mg | PEN_INJECTOR | SUBCUTANEOUS | 2 refills | Status: AC
  Filled 2024-12-02 – 2025-01-06 (×4): qty 3, 28d supply, fill #0

## 2024-12-10 ENCOUNTER — Other Ambulatory Visit: Payer: Self-pay

## 2024-12-12 ENCOUNTER — Other Ambulatory Visit: Payer: Self-pay

## 2025-01-06 ENCOUNTER — Other Ambulatory Visit: Payer: Self-pay
